# Patient Record
Sex: Female | Born: 1980 | Race: White | Hispanic: No | Marital: Married | Smoking: Never smoker
Health system: Southern US, Community
[De-identification: ages and names within clinical notes are randomized; demographics above are authoritative.]

## PROBLEM LIST (undated history)

## (undated) DIAGNOSIS — Z8619 Personal history of other infectious and parasitic diseases: Secondary | ICD-10-CM

## (undated) DIAGNOSIS — R87629 Unspecified abnormal cytological findings in specimens from vagina: Secondary | ICD-10-CM

## (undated) DIAGNOSIS — D649 Anemia, unspecified: Secondary | ICD-10-CM

## (undated) HISTORY — PX: NOSE SURGERY: SHX723

## (undated) HISTORY — PX: BREAST ENHANCEMENT SURGERY: SHX7

## (undated) HISTORY — PX: DILATION AND CURETTAGE OF UTERUS: SHX78

## (undated) HISTORY — DX: Unspecified abnormal cytological findings in specimens from vagina: R87.629

## (undated) HISTORY — DX: Personal history of other infectious and parasitic diseases: Z86.19

---

## 2008-08-30 HISTORY — PX: FOOT SURGERY: SHX648

## 2013-08-30 HISTORY — PX: HAND SURGERY: SHX662

## 2013-12-02 ENCOUNTER — Encounter (HOSPITAL_COMMUNITY): Payer: Self-pay | Admitting: Emergency Medicine

## 2013-12-02 ENCOUNTER — Emergency Department (HOSPITAL_COMMUNITY)
Admission: EM | Admit: 2013-12-02 | Discharge: 2013-12-02 | Disposition: A | Discharge status: Home / Self Care | Payer: BC Managed Care – PPO | Attending: Emergency Medicine | Admitting: Emergency Medicine

## 2013-12-02 ENCOUNTER — Emergency Department (HOSPITAL_COMMUNITY): Payer: BC Managed Care – PPO

## 2013-12-02 DIAGNOSIS — M25449 Effusion, unspecified hand: Secondary | ICD-10-CM | POA: Insufficient documentation

## 2013-12-02 DIAGNOSIS — Y9389 Activity, other specified: Secondary | ICD-10-CM | POA: Insufficient documentation

## 2013-12-02 DIAGNOSIS — Z79899 Other long term (current) drug therapy: Secondary | ICD-10-CM | POA: Insufficient documentation

## 2013-12-02 DIAGNOSIS — S62339A Displaced fracture of neck of unspecified metacarpal bone, initial encounter for closed fracture: Secondary | ICD-10-CM

## 2013-12-02 DIAGNOSIS — Y929 Unspecified place or not applicable: Secondary | ICD-10-CM | POA: Insufficient documentation

## 2013-12-02 DIAGNOSIS — S62309A Unspecified fracture of unspecified metacarpal bone, initial encounter for closed fracture: Secondary | ICD-10-CM | POA: Insufficient documentation

## 2013-12-02 DIAGNOSIS — W010XXA Fall on same level from slipping, tripping and stumbling without subsequent striking against object, initial encounter: Secondary | ICD-10-CM | POA: Insufficient documentation

## 2013-12-02 MED ORDER — OXYCODONE-ACETAMINOPHEN 5-325 MG PO TABS: 1.0000 | ORAL_TABLET | ORAL | Status: DC | PRN

## 2013-12-02 NOTE — ED Notes (Signed)
Patient transported to X-ray 

## 2013-12-02 NOTE — ED Notes (Signed)
Pt states that she tripped, fell, and injured her rt hand this morning around 0800.  Swelling noted to outside of rt hand.

## 2013-12-02 NOTE — Discharge Instructions (Signed)
Take the prescribed medication as directed for pain. Leave splint in place until follow-up appt. Follow-up with Dr. Melvyn Novasrtmann on Tuesday-- call office to verify appt time. Return to the ED for new or worsening symptoms.

## 2013-12-02 NOTE — ED Provider Notes (Signed)
Medical screening examination/treatment/procedure(s) were performed by non-physician practitioner and as supervising physician I was immediately available for consultation/collaboration.   EKG Interpretation None       Ethelda ChickMartha K Linker, MD 12/02/13 1253

## 2013-12-02 NOTE — ED Provider Notes (Signed)
CSN: [id number]     Arrival date & time 12/02/13  0840 History   First MD Initiated Contact with Patient 12/02/13 0930     Chief Complaint  Patient presents with  . Fall  . Hand Injury     (Consider location/radiation/quality/duration/timing/severity/associated sxs/prior Treatment) Patient is a 33 y.o. female presenting with fall and hand injury. The history is provided by medical records and the patient.  Fall Associated symptoms include arthralgias and joint swelling.  Hand Injury  This is a 33 y.o. F with no significant PMH presenting to the ED for right hand injury. Patient states she tripped and fell, landing awkwardly on her right hand. She denies head trauma or loss of consciousness. The fascia of sudden onset of pain and swelling to ulnar aspect of the dorsal right hand. Denies numbness/paresthesias. She denies prior right hand injury or surgery. Patient is right-hand dominant.  History reviewed. No pertinent past medical history. Past Surgical History  Procedure Laterality Date  . Foot surgery     History reviewed. No pertinent family history. History  Substance Use Topics  . Smoking status: Never Smoker   . Smokeless tobacco: Not on file  . Alcohol Use: No   OB History   Grav Para Term Preterm Abortions TAB SAB Ect Mult Living                 Review of Systems  Musculoskeletal: Positive for arthralgias and joint swelling.  All other systems reviewed and are negative.      Allergies  Review of patient's allergies indicates no known allergies.  Home Medications   Current Outpatient Rx  Name  Route  Sig  Dispense  Refill  . ibuprofen (ADVIL,MOTRIN) 200 MG tablet   Oral   Take 400 mg by mouth every 6 (six) hours as needed for headache.         . Norethindrone-Ethinyl Estradiol-Fe Biphas (LO LOESTRIN FE) 1 MG-10 MCG / 10 MCG tablet   Oral   Take 1 tablet by mouth every morning.          BP 100/59  Pulse 72  Temp(Src) 98.1 F (36.7 C) (Oral)  Resp  17  SpO2 100%  LMP 05/14/2013  Physical Exam  Nursing note and vitals reviewed. Constitutional: She is oriented to person, place, and time. She appears well-developed and well-nourished.  HENT:  Head: Normocephalic and atraumatic.  Mouth/Throat: Oropharynx is clear and moist.  Eyes: Conjunctivae and EOM are normal. Pupils are equal, round, and reactive to light.  Neck: Normal range of motion. Neck supple.  Cardiovascular: Normal rate, regular rhythm and normal heart sounds.   Pulmonary/Chest: Effort normal and breath sounds normal. No respiratory distress. She has no wheezes.  Musculoskeletal:       Right hand: She exhibits decreased range of motion, tenderness and bony tenderness. She exhibits normal two-point discrimination, normal capillary refill, no deformity, no laceration and no swelling. Normal sensation noted. Normal strength noted.       Hands: Swelling, bruising, and TTP along right 5th MCP; no gross deformity or skin tenting; full ROM of wrist maintained; moving all fingers appropraitely; strong radial pulse and cap refill; sensation intact  Neurological: She is alert and oriented to person, place, and time.  Skin: Skin is warm and dry. She is not diaphoretic.  Psychiatric: She has a normal mood and affect.    ED Course  Procedures (including critical care time) Labs Review Labs Reviewed - No data to display Imaging Review Dg  Hand Complete Right  12/02/2013   CLINICAL DATA:  FALL HAND INJURY  EXAM: RIGHT HAND - COMPLETE 3+ VIEW  COMPARISON:  None.  FINDINGS: Comminuted, impacted fracture of the head of the fifth metatarsal with volar angulation and displacement.  IMPRESSION: Fracture involving the head of the fifth metatarsal.   Electronically Signed   By: Salome HolmesHector  Cooper M.D.   On: 12/02/2013 09:55     EKG Interpretation None      MDM   Final diagnoses:  Boxer's fracture   Patient with comminuted and impacted boxer's fracture of right hand. Hand remains NVI,  compartment soft, without tenting.  Full ROM of wrist maintained.  Case was discussed with on-call hand surgery, Dr. Melvyn Novasrtmann, who will see patient in office on Tuesday for follow-up.  Pt placed in ulnar gutter splint.  Rx percocet.  Discussed plan with pt, she acknowledge understanding and agreed with plan of care.  Return precautions advised for new or worsening symptoms.  Garlon HatchetLisa M Camillo Quadros, PA-C 12/02/13 1246

## 2013-12-02 NOTE — ED Notes (Signed)
Pt refused discharge vitals 

## 2015-04-18 IMAGING — CR DG HAND COMPLETE 3+V*R*
4 series · 4 of 4 positions shown · non-contrast
Comparison: None.

CLINICAL DATA: FALL HAND INJURY

EXAM:
RIGHT HAND - COMPLETE 3+ VIEW

[x hand pa right]
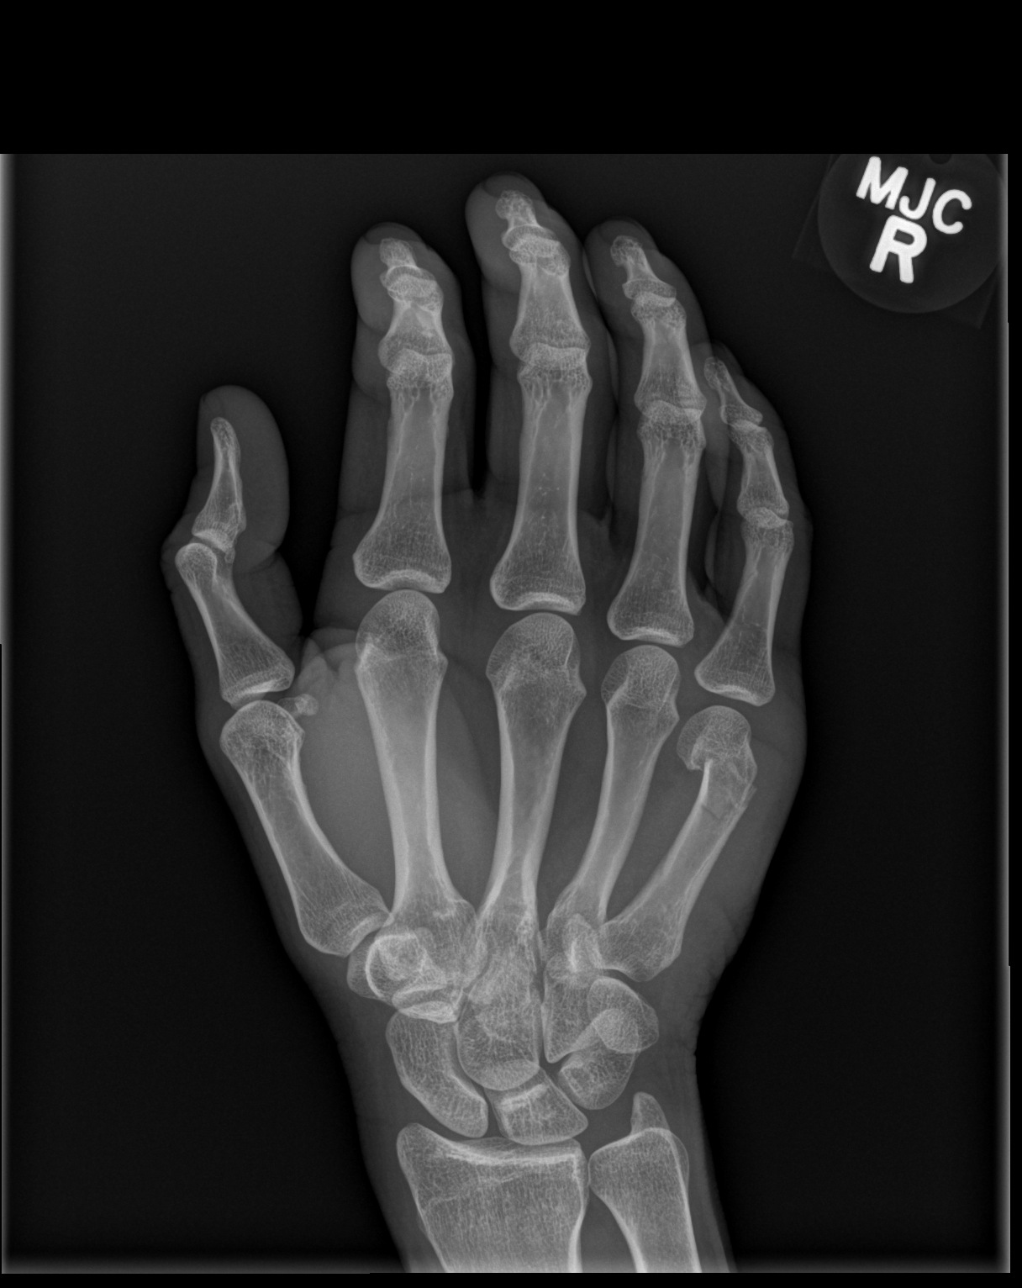

[x hand obl right]
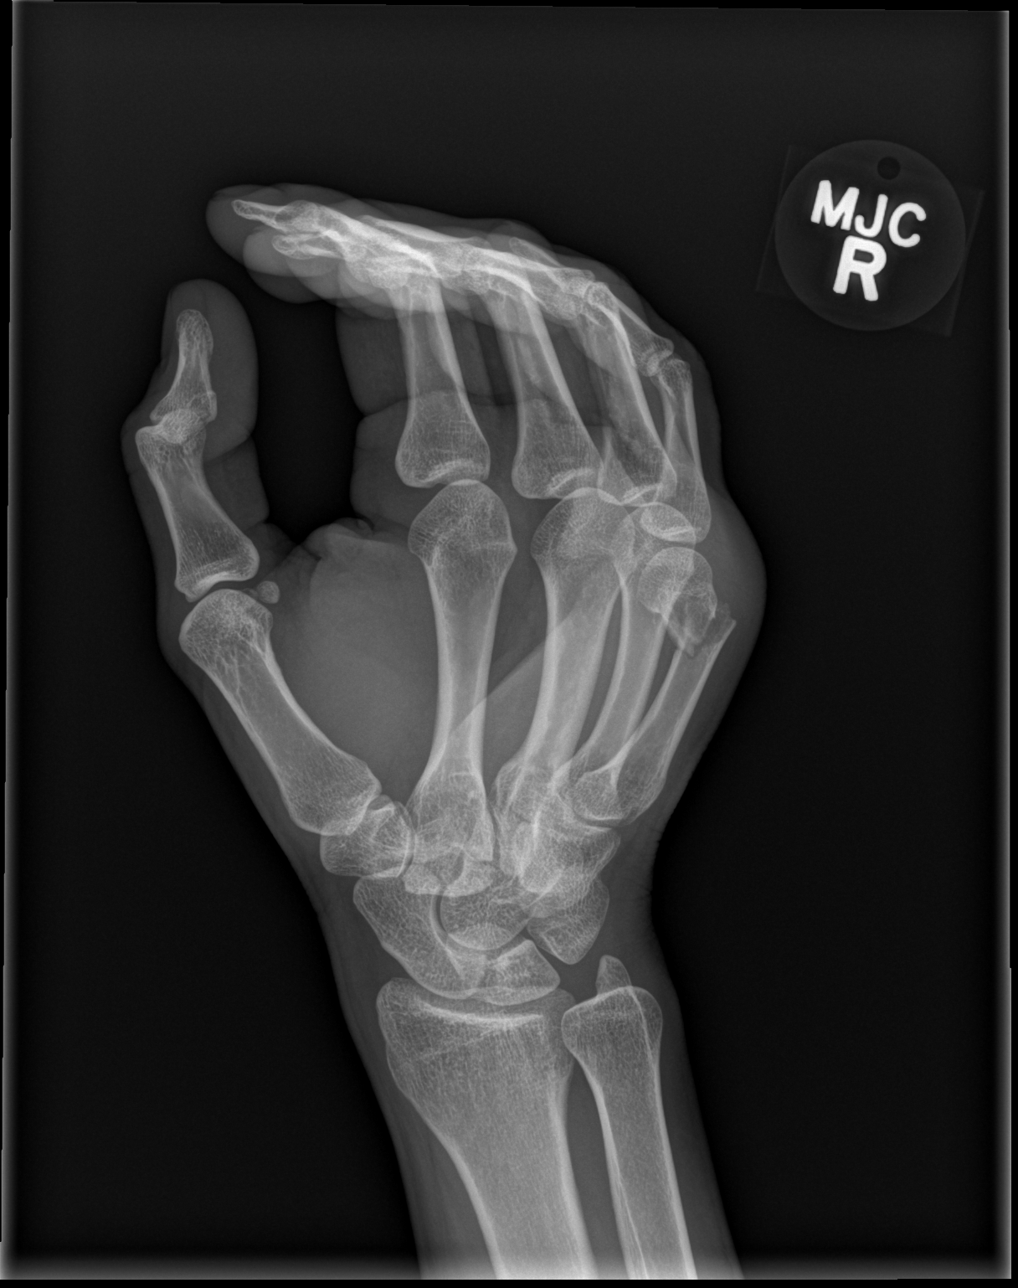

[x hand lat right (1 of 2)]
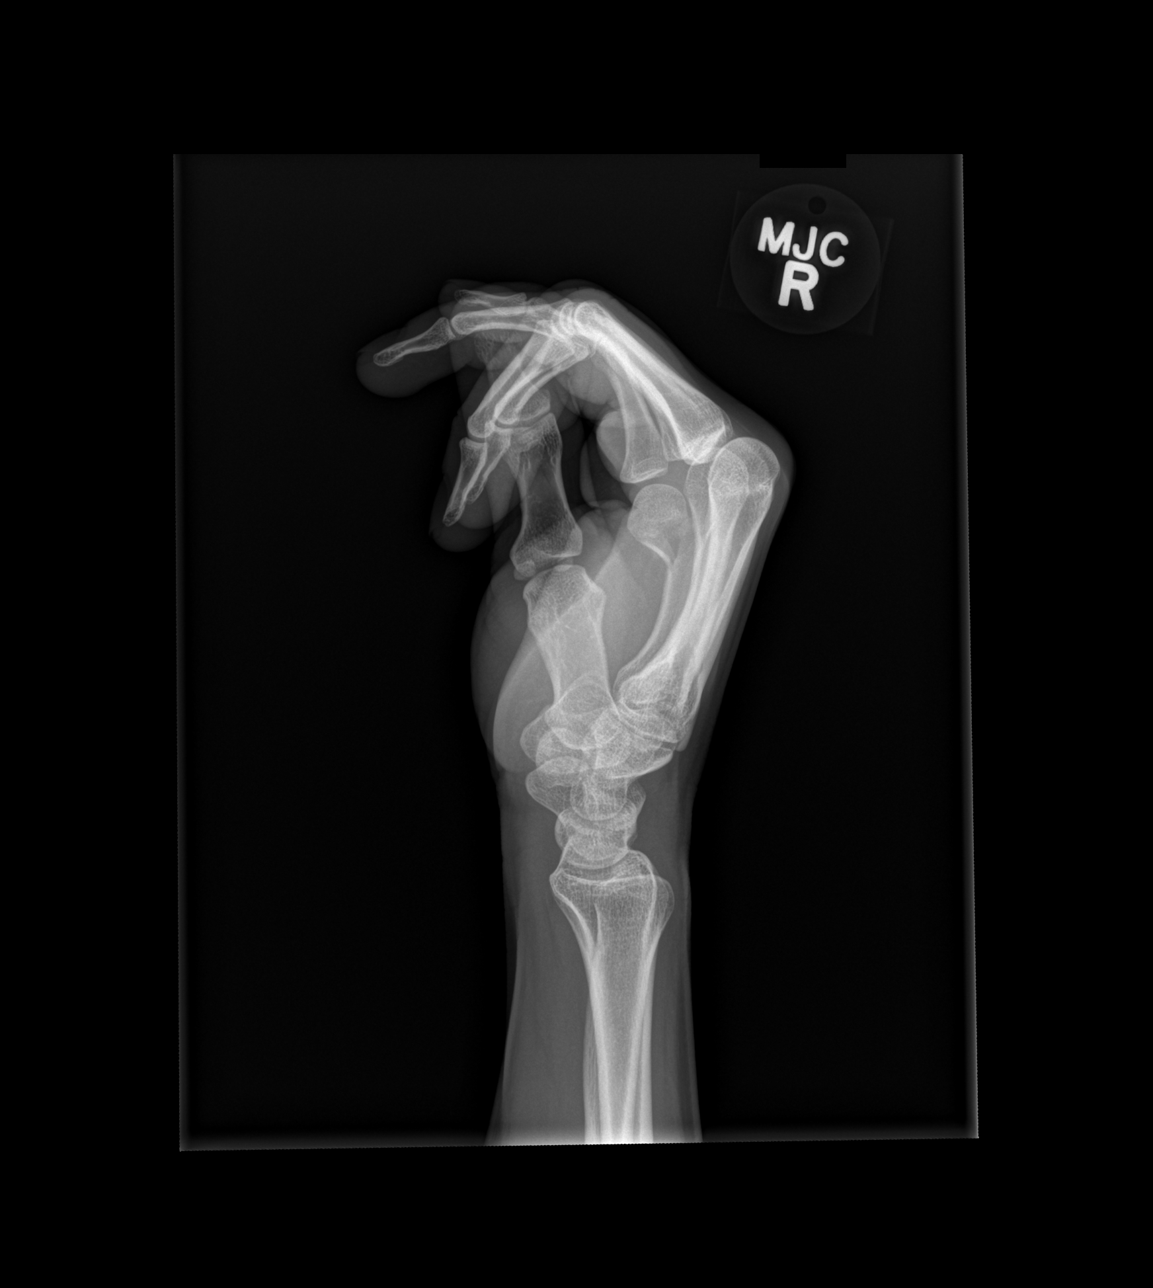

[x hand lat right (2 of 2)]
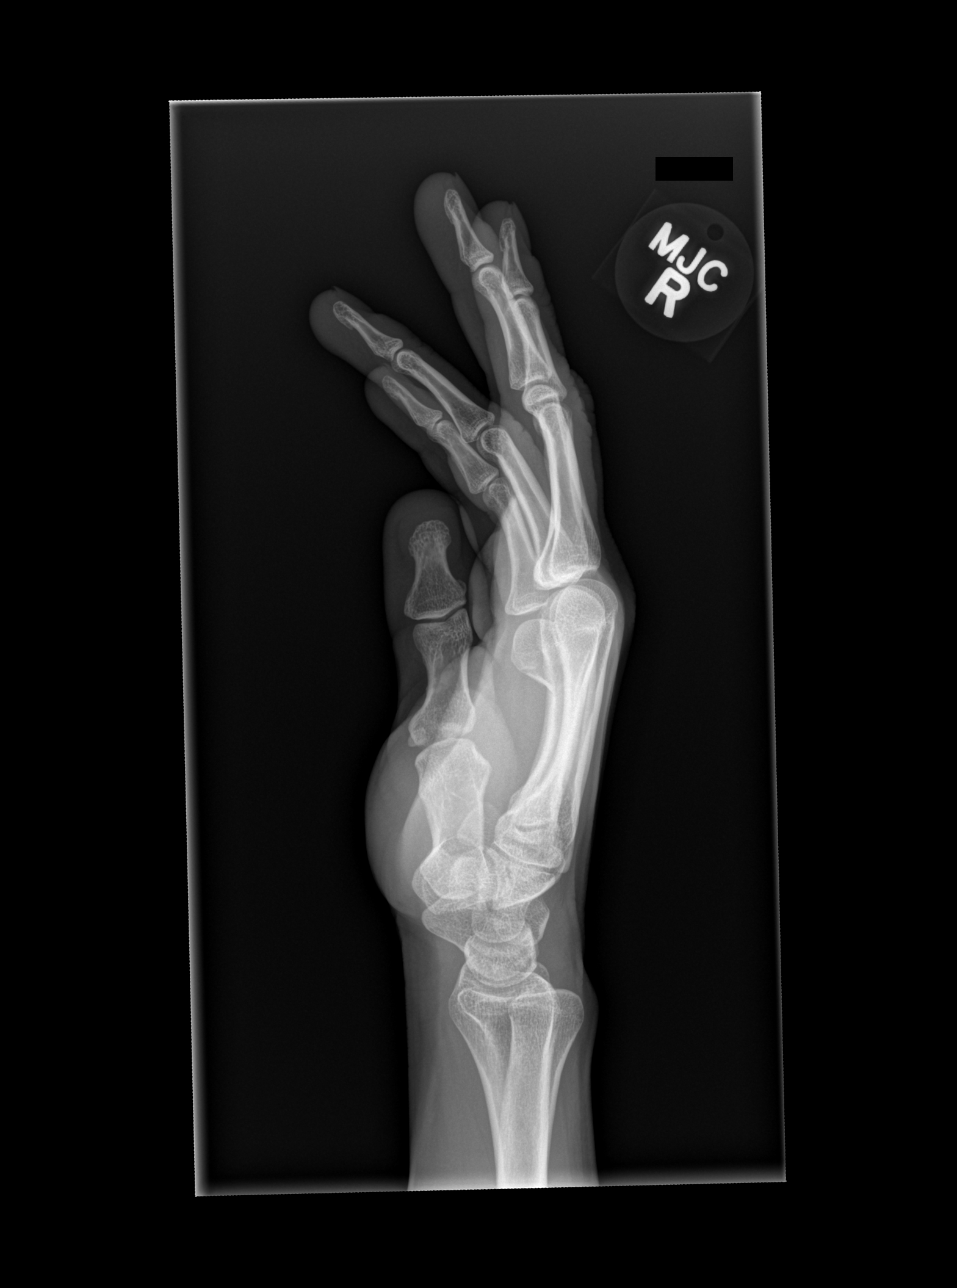

[4 of 4 positions shown; findings below may reference images not displayed]

FINDINGS: Comminuted, impacted fracture of the head of the fifth metatarsal
with volar angulation and displacement.
IMPRESSION: Fracture involving the head of the fifth metatarsal.

## 2017-08-30 NOTE — L&D Delivery Note (Signed)
Delivery Note:   Complete dilation at 08/13/2018  2220 Onset of pushing at 2220 FHR second stage Category 2 - variables  Analgesia /Anesthesia intrapartum:Epidural   Delivery of a Live born female  Birth Weight:pending   APGAR: 8, 9  Newborn Delivery   Birth date/time:  08/13/2018 23:45:00 Delivery type:  Vaginal, Spontaneous     Nuchal Cord:x 1 loose, reduced on perineum Cord double clamped after cessation of pulsation, cut by Trey PaulaJeff (FOB).  Collection of cord blood donation-None  Arterial cord blood sample-No   Cord blood collected for typing.    Placenta delivered-manual, partial separation with brisk bleeding / adherent,  with 3 vessels . Placenta to L&D for disposal.. Uterine tone firm after placenta removal, uterus explored , no placental parts left, bleeding small.  Bilateral deep labial splays identified.  Anesthesia:Epidural , local lido Repair:  3.0 and 2.0 vicryl running stitch, good hemostasis Est. Blood Loss (mL):842.00   Complications: ,Other Labor Complications: PROM > 24 hrs, protracted early labor needing cervical ripening with cervical balloon and cytotec, then Pitocin augmentation of labor at 8 cm.  APGAR:1 min-8, 5 min-9 Mom to postpartum.  Baby to Couplet care / Skin to Skin.  Ancef 2 gm IVPB for uterine exploration Methergine 0.2 mg PO q 4 hrs x 24 hrs prophylactic for retained placenta.   Delivery Report:  Review the Delivery Report for details.     Signed: Neta Mendsaniela C Astra Gregg, CNM, MSN 08/14/2018, 12:30 AM

## 2018-01-03 LAB — OB RESULTS CONSOLE RUBELLA ANTIBODY, IGM: Rubella: IMMUNE

## 2018-01-03 LAB — OB RESULTS CONSOLE HEPATITIS B SURFACE ANTIGEN: Hepatitis B Surface Ag: NEGATIVE

## 2018-01-03 LAB — OB RESULTS CONSOLE HIV ANTIBODY (ROUTINE TESTING): HIV: NONREACTIVE

## 2018-01-03 LAB — OB RESULTS CONSOLE RPR: RPR: NONREACTIVE

## 2018-07-20 LAB — OB RESULTS CONSOLE GBS: GBS: NEGATIVE

## 2018-08-13 ENCOUNTER — Other Ambulatory Visit: Payer: Self-pay

## 2018-08-13 ENCOUNTER — Inpatient Hospital Stay (HOSPITAL_COMMUNITY)
Admission: AD | Admit: 2018-08-13 | Discharge: 2018-08-15 | DRG: 807 | Disposition: A | Discharge status: Home / Self Care | Payer: No Typology Code available for payment source

## 2018-08-13 ENCOUNTER — Inpatient Hospital Stay (HOSPITAL_COMMUNITY): Payer: No Typology Code available for payment source | Admitting: Anesthesiology

## 2018-08-13 ENCOUNTER — Encounter (HOSPITAL_COMMUNITY): Payer: Self-pay | Admitting: *Deleted

## 2018-08-13 DIAGNOSIS — Z3A40 40 weeks gestation of pregnancy: Secondary | ICD-10-CM | POA: Diagnosis not present

## 2018-08-13 DIAGNOSIS — O4212 Full-term premature rupture of membranes, onset of labor more than 24 hours following rupture: Secondary | ICD-10-CM | POA: Diagnosis present

## 2018-08-13 DIAGNOSIS — O4290 Premature rupture of membranes, unspecified as to length of time between rupture and onset of labor, unspecified weeks of gestation: Secondary | ICD-10-CM | POA: Diagnosis present

## 2018-08-13 DIAGNOSIS — O9902 Anemia complicating childbirth: Secondary | ICD-10-CM | POA: Diagnosis present

## 2018-08-13 DIAGNOSIS — D509 Iron deficiency anemia, unspecified: Secondary | ICD-10-CM | POA: Diagnosis present

## 2018-08-13 DIAGNOSIS — O99019 Anemia complicating pregnancy, unspecified trimester: Secondary | ICD-10-CM | POA: Diagnosis present

## 2018-08-13 DIAGNOSIS — Z3490 Encounter for supervision of normal pregnancy, unspecified, unspecified trimester: Secondary | ICD-10-CM

## 2018-08-13 LAB — CBC
HCT: 32.2 % — ABNORMAL LOW (ref 36.0–46.0)
Hemoglobin: 10.2 g/dL — ABNORMAL LOW (ref 12.0–15.0)
MCH: 26.4 pg (ref 26.0–34.0)
MCHC: 31.7 g/dL (ref 30.0–36.0)
MCV: 83.2 fL (ref 80.0–100.0)
PLATELETS: 247 10*3/uL (ref 150–400)
RBC: 3.87 MIL/uL (ref 3.87–5.11)
RDW: 14.2 % (ref 11.5–15.5)
WBC: 10.3 10*3/uL (ref 4.0–10.5)
nRBC: 0 % (ref 0.0–0.2)

## 2018-08-13 LAB — WET PREP, GENITAL
CLUE CELLS WET PREP: NONE SEEN
Sperm: NONE SEEN
TRICH WET PREP: NONE SEEN
Yeast Wet Prep HPF POC: NONE SEEN

## 2018-08-13 LAB — TYPE AND SCREEN
ABO/RH(D): A POS
Antibody Screen: NEGATIVE

## 2018-08-13 LAB — AMNISURE RUPTURE OF MEMBRANE (ROM) NOT AT ARMC: AMNISURE: POSITIVE

## 2018-08-13 LAB — ABO/RH: ABO/RH(D): A POS

## 2018-08-13 MED ORDER — EPHEDRINE 5 MG/ML INJ: 10.0000 mg | INTRAVENOUS | Status: DC | PRN

## 2018-08-13 MED ORDER — MISOPROSTOL 50MCG HALF TABLET
50.0000 ug | ORAL_TABLET | ORAL | Status: DC | PRN
  Administered 2018-08-13: 50 ug via BUCCAL
  Filled 2018-08-13 (×2): qty 1

## 2018-08-13 MED ORDER — OXYTOCIN BOLUS FROM INFUSION: 500.0000 mL | Freq: Once | INTRAVENOUS | Status: DC

## 2018-08-13 MED ORDER — LACTATED RINGERS IV SOLN
INTRAVENOUS | Status: DC
  Administered 2018-08-13: 20:00:00 via INTRAVENOUS

## 2018-08-13 MED ORDER — LACTATED RINGERS IV SOLN: INTRAVENOUS | Status: DC

## 2018-08-13 MED ORDER — TERBUTALINE SULFATE 1 MG/ML IJ SOLN
0.2500 mg | Freq: Once | INTRAMUSCULAR | Status: DC | PRN
  Filled 2018-08-13: qty 1

## 2018-08-13 MED ORDER — EPHEDRINE 5 MG/ML INJ
10.0000 mg | INTRAVENOUS | Status: DC | PRN
  Filled 2018-08-13: qty 2
  Filled 2018-08-13: qty 4

## 2018-08-13 MED ORDER — LACTATED RINGERS IV SOLN: 500.0000 mL | Freq: Once | INTRAVENOUS | Status: DC

## 2018-08-13 MED ORDER — OXYCODONE-ACETAMINOPHEN 5-325 MG PO TABS: 1.0000 | ORAL_TABLET | ORAL | Status: DC | PRN

## 2018-08-13 MED ORDER — ACETAMINOPHEN 325 MG PO TABS
650.0000 mg | ORAL_TABLET | ORAL | Status: DC | PRN
  Administered 2018-08-14: 650 mg via ORAL
  Filled 2018-08-13: qty 2

## 2018-08-13 MED ORDER — EPHEDRINE 5 MG/ML INJ
10.0000 mg | INTRAVENOUS | Status: DC | PRN
  Filled 2018-08-13: qty 2

## 2018-08-13 MED ORDER — SOD CITRATE-CITRIC ACID 500-334 MG/5ML PO SOLN: 30.0000 mL | ORAL | Status: DC | PRN

## 2018-08-13 MED ORDER — OXYTOCIN 10 UNIT/ML IJ SOLN
10.0000 [IU] | Freq: Once | INTRAMUSCULAR | Status: DC
  Filled 2018-08-13: qty 1

## 2018-08-13 MED ORDER — PHENYLEPHRINE 40 MCG/ML (10ML) SYRINGE FOR IV PUSH (FOR BLOOD PRESSURE SUPPORT)
80.0000 ug | PREFILLED_SYRINGE | INTRAVENOUS | Status: DC | PRN
  Administered 2018-08-13: 80 ug via INTRAVENOUS
  Filled 2018-08-13 (×3): qty 10

## 2018-08-13 MED ORDER — OXYCODONE-ACETAMINOPHEN 5-325 MG PO TABS: 2.0000 | ORAL_TABLET | ORAL | Status: DC | PRN

## 2018-08-13 MED ORDER — PHENYLEPHRINE 40 MCG/ML (10ML) SYRINGE FOR IV PUSH (FOR BLOOD PRESSURE SUPPORT)
80.0000 ug | PREFILLED_SYRINGE | INTRAVENOUS | Status: AC | PRN
  Administered 2018-08-13 (×3): 80 ug via INTRAVENOUS

## 2018-08-13 MED ORDER — MINERAL OIL PO OIL
TOPICAL_OIL | Freq: Once | ORAL | Status: AC
  Administered 2018-08-13: 30 mL
  Filled 2018-08-13: qty 30

## 2018-08-13 MED ORDER — FLEET ENEMA 7-19 GM/118ML RE ENEM: 1.0000 | ENEMA | Freq: Once | RECTAL | Status: DC | PRN

## 2018-08-13 MED ORDER — LACTATED RINGERS IV SOLN: 500.0000 mL | INTRAVENOUS | Status: DC | PRN

## 2018-08-13 MED ORDER — FENTANYL 2.5 MCG/ML BUPIVACAINE 1/10 % EPIDURAL INFUSION (WH - ANES)
14.0000 mL/h | INTRAMUSCULAR | Status: DC | PRN
  Administered 2018-08-13: 14 mL/h via EPIDURAL
  Filled 2018-08-13: qty 100

## 2018-08-13 MED ORDER — ONDANSETRON HCL 4 MG/2ML IJ SOLN: 4.0000 mg | Freq: Four times a day (QID) | INTRAMUSCULAR | Status: DC | PRN

## 2018-08-13 MED ORDER — OXYTOCIN 40 UNITS IN LACTATED RINGERS INFUSION - SIMPLE MED
1.0000 m[IU]/min | INTRAVENOUS | Status: DC
  Administered 2018-08-13: 2 m[IU]/min via INTRAVENOUS
  Filled 2018-08-13: qty 1000

## 2018-08-13 MED ORDER — DIPHENHYDRAMINE HCL 50 MG/ML IJ SOLN: 12.5000 mg | INTRAMUSCULAR | Status: DC | PRN

## 2018-08-13 MED ORDER — BISACODYL 10 MG RE SUPP
10.0000 mg | Freq: Once | RECTAL | Status: DC
  Filled 2018-08-13: qty 1

## 2018-08-13 MED ORDER — OXYTOCIN 40 UNITS IN LACTATED RINGERS INFUSION - SIMPLE MED: 2.5000 [IU]/h | INTRAVENOUS | Status: DC

## 2018-08-13 MED ORDER — PHENYLEPHRINE 40 MCG/ML (10ML) SYRINGE FOR IV PUSH (FOR BLOOD PRESSURE SUPPORT): 80.0000 ug | PREFILLED_SYRINGE | INTRAVENOUS | Status: DC | PRN

## 2018-08-13 MED ORDER — LIDOCAINE HCL (PF) 1 % IJ SOLN
INTRAMUSCULAR | Status: DC | PRN
  Administered 2018-08-13: 5 mL via EPIDURAL
  Administered 2018-08-13: 3 mL via EPIDURAL
  Administered 2018-08-13: 2 mL via EPIDURAL

## 2018-08-13 MED ORDER — LIDOCAINE HCL (PF) 1 % IJ SOLN
30.0000 mL | INTRAMUSCULAR | Status: DC | PRN
  Administered 2018-08-14: 30 mL via SUBCUTANEOUS
  Filled 2018-08-13: qty 30

## 2018-08-13 NOTE — MAU Note (Signed)
Rosalita ChessmanLyndsey Aloi is a 37 y.o. at 434w3d here in MAU reporting: +?LOF; clear; since yesterday Patient reports EDD of last Thursday (08/10/18) Pain score: denies at this time Vitals:   08/13/18 0949  BP: 120/72  Pulse: 69  Resp: 19  Temp: (!) 97.5 F (36.4 C)  SpO2: 100%     FHT: 130 Lab orders placed from triage: none

## 2018-08-13 NOTE — Anesthesia Pain Management Evaluation Note (Signed)
  CRNA Pain Management Visit Note  Patient: Andrea Bradford, 37 y.o., female  "Hello I am a member of the anesthesia team at [facility]. We have an anesthesia team available at all times to provide care throughout the hospital, including epidural management and anesthesia for C-section. I don't know your plan for the delivery whether it a natural birth, water birth, IV sedation, nitrous supplementation, doula or epidural, but we want to meet your pain goals."   1.Was your pain managed to your expectations on prior hospitalizations?   No prior hospitalizations  2.What is your expectation for pain management during this hospitalization?     Water tub  3.How can we help you reach that goal? Patient would like a natural water birth   Record the patient's initial score and the patient's pain goal.   Pain: 9  Pain Goal: 10 [facility] wants you to be able to say your pain was always managed very well.  Rica RecordsICKELTON,Casimer Russett 08/13/2018

## 2018-08-13 NOTE — H&P (Signed)
OB ADMISSION/ HISTORY & PHYSICAL:  Admission Date: 08/13/2018  9:31 AM  Admit Diagnosis: PROM at term    Andrea Bradford is a 37 y.o. female presenting for LOF. Reports feeling more "wet" on Thursday while at work, then intermittently feeling wet Saturday. Placed a pad overnight and reports half soaked w/clear appearing fluid in AM when she called to report to CNM. Reports feeling more cramping last couple of days, but no obvious ctx pattern.   Prenatal History: G2P0010  EDC : 08/10/2018,  [facility] 30.6 [facility] , [facility] CNM [facility].   Prenatal course complicated by: AMA, IVF pregnancy, hx vanishing twin in 1st trim, IDA anemia on supplement ASCUS pap w/ + HR HPV, for F/U PP  Prenatal Labs: ABO, Rh:   A pos Antibody: NEG (12/15 1107) Rubella:   immune RPR:   NR HBsAg:  Neg  HIV:   Neg GBS:   neg  2 hr Glucola : pass Genetic Screening: NT/ultrascreen and MSAFP wnl Ultrasound: normal XX, anterior placenta  Medical / Surgical History :  Past medical history: History reviewed. No pertinent past medical history.   Past surgical history:  Past Surgical History:  Procedure Laterality Date  . FOOT SURGERY  2010  . HAND SURGERY  2015  . NOSE SURGERY     1998 and 2018   Breast augmentation  Family History:  Family History  Problem Relation Age of Onset  . Cancer Mother   . Cancer Sister      Social History:  reports that she has never smoked. She has never used smokeless tobacco. She reports that she does not drink alcohol or use drugs.   Allergies: Patient has no known allergies.   Current Medications at time of admission:  Medications Prior to Admission  Medication Sig Dispense Refill Last Dose  . Prenatal Vit-Fe Fumarate-FA (PRENATAL MULTIVITAMIN) TABS tablet Take 1 tablet by mouth daily at 12 noon.   08/12/2018 at Unknown time     Review of Systems: ROS Mild cramping, +LOF, +FM, no fever/chills  Physical  Exam: Vital signs and nursing notes reviewed.  ED Triage Vitals [08/13/18 0949]  Enc Vitals Group     BP 120/72     Pulse Rate 69     Resp 19     Temp (!) 97.5 F (36.4 C)     Temp Source Oral     SpO2 100 %     Weight 184 lb 6.4 oz (83.6 kg)     Height      Head Circumference      Peak Flow      Pain Score      Pain Loc      Pain Edu?      Excl. in GC?      General: AAO x 3, NAD Heart: RRR Lungs:CTAB Abdomen: Gravid, NT, Leopold's vrtx, EFW 7.5-8 lbs Extremities: no edema Genitalia / VE: Dilation: 2.5 Effacement (%): 80 Station: -2 Presentation: Vertex Exam by:: Arlan Organaniela Mahati Vajda CNM  Forebag noted, clear AF + amnisure, neg wet prep  FHR:  125 BPM, moderate variability, + accels, no decels TOCO: Ctx q 2-5 min, mild pain in back and r side  Labs:   Pending T&S, RPR  Recent Labs    08/13/18 1059  WBC 10.3  HGB 10.2*  HCT 32.2*  PLT 247       Assessment:  37 y.o. G1P0 at 7888w3d, IVF pregnancy, PROM > 24 hrs  1. Latent stage of labor 2. FHR category 1 3. GBS negative 4. Desires low intervention / Musician and birth 5. Breastfeeding   Plan:  1. Admit to BS, recommend IOL given likely ROM > 24 hours and no active labor Advised Pitocin IOL for expedited management and decreased risk of chorio.  Declines Pitocin but agrees to oral Cytotec and cervical balloon if needed. Wants to avoid continuous EFM and IV placement. Monitor closely for s/sx chorio, Limited cervical exams Cytotec 50 mcg buccal q 4 hrs PRN, will place CB if no change after first dose.  2. Routine L&D orders 3. Continuous labor support / doula, hydrotherapy in active labor if stable 4. Anticipate NSVB 5. Placenta disposal per patient request  Dr Billy Coast notified of admission / plan of care   Neta Mends CNM, MSN 08/13/2018, 2:02 PM

## 2018-08-13 NOTE — Progress Notes (Signed)
Patient ID: Andrea Bradford, female   DOB: 1980/10/03, 37 y.o.   MRN: [id number] Andrea Bradford is a 37 y.o. G2P0010 at 7138w3d by  ETD ,admitted for IOL / PROM  Subjective: Starting to feel ctx more, resting in lateral position, one dose oral Cytotec given 1.5 hrs ago.  Continues to leak clear AF occasionally. Has some bloody show since exam I MAU.  Plans waterbirth, using doula. [facility] and [facility].   Objective: Vitals:   08/13/18 0949 08/13/18 1156 08/13/18 1318  BP: 120/72 113/69 (!) 107/55  Pulse: 69 71 69  Resp: 19 16   Temp: (!) 97.5 F (36.4 C) 98.4 F (36.9 C) 98.5 F (36.9 C)  TempSrc: Oral Oral Oral  SpO2: 100%    Weight: 83.6 kg 82.1 kg   Height:  5\' 6"  (1.676 m)     FHT:  FHR: 125 bpm, variability: moderate,  accelerations:  Present,  decelerations:  Absent UC:   regular, every 1-3 minutes SVE:   Dilation: 2.5 Effacement (%): 80 Station: -2 Exam by:: Arlan Organaniela Mercadez Heitman CNM Unchanged since previous exam. Cervical balloon placed after counseling, inflated w/ > 60 cc and traction to leg. Patient coping well but looks a lot more uncomfortable.   Labs:   Recent Labs    08/13/18 1059  WBC 10.3  HGB 10.2*  HCT 32.2*  PLT 247    Assessment / Plan: G2P0010 at 40.3 wks  PROM > 24 hrs,   Labor: Cervical ripening with oral Cytotec, x 1 dose, now w/ added cervical balloon  Preeclampsia:  no signs or symptoms of toxicity Fetal Wellbeing:  Category I Pain Control:  Labor support without medications and plans hydrotherapy in active labor I/D:   afebrile, no s/sx chorio, GBS neg.  Anticipated MOD:  NSVD IVF pregnancy - AMTSL  Neta Mendsaniela C Shree Espey, CNM, MSN 08/13/2018, 1:41 PM

## 2018-08-13 NOTE — Anesthesia Preprocedure Evaluation (Signed)
Anesthesia Evaluation  Patient identified by MRN, date of birth, ID band Patient awake    Reviewed: Allergy & Precautions, Patient's Chart, lab work & pertinent test results  Airway Mallampati: II  TM Distance: >3 FB     Dental   Pulmonary neg pulmonary ROS,    Pulmonary exam normal        Cardiovascular negative cardio ROS Normal cardiovascular exam     Neuro/Psych negative neurological ROS     GI/Hepatic negative GI ROS, Neg liver ROS,   Endo/Other  negative endocrine ROS  Renal/GU negative Renal ROS     Musculoskeletal   Abdominal   Peds  Hematology  (+) anemia ,   Anesthesia Other Findings   Reproductive/Obstetrics (+) Pregnancy                             Lab Results  Component Value Date   WBC 10.3 08/13/2018   HGB 10.2 (L) 08/13/2018   HCT 32.2 (L) 08/13/2018   MCV 83.2 08/13/2018   PLT 247 08/13/2018    Anesthesia Physical Anesthesia Plan  ASA: II  Anesthesia Plan: Epidural   Post-op Pain Management:    Induction:   PONV Risk Score and Plan: Treatment may vary due to age or medical condition  Airway Management Planned: Natural Airway  Additional Equipment:   Intra-op Plan:   Post-operative Plan:   Informed Consent: I have reviewed the patients History and Physical, chart, labs and discussed the procedure including the risks, benefits and alternatives for the proposed anesthesia with the patient or authorized representative who has indicated his/her understanding and acceptance.     Plan Discussed with:   Anesthesia Plan Comments:         Anesthesia Quick Evaluation

## 2018-08-13 NOTE — Progress Notes (Signed)
S: Labored in various position, trial of hydrotherapy then nitrous, working with doula but unable to relax, vocalizing w/ ctx. Trey PaulaJeff supporting/coaching labor.   O:  Vitals:   08/13/18 1440 08/13/18 1555 08/13/18 1700 08/13/18 1755  BP: (!) 95/53 95/61 (!) 105/53 (!) 108/56  Pulse: 82 96 88 88  Resp: 18 16 18    Temp:   98.4 F (36.9 C)   TempSrc:   Oral   SpO2:      Weight:      Height:       FHR 125, mod var, + accels, no decels Ctx q 2-3 min, palp mod/strong  VE unchanged 7-8/90/-1, asynclitic  A/P: Protracted labor FHT cat1 Poor coping w/ labor pain  Advise epidural and Pitocin augmentation, r/b reviewed, patient agrees.  Plan IUPC when comfortable  Neta Mendsaniela C Paul, MSN, CNM 08/13/2018, 7:15 PM

## 2018-08-13 NOTE — Anesthesia Procedure Notes (Signed)
Epidural Patient location during procedure: OB Start time: 08/13/2018 7:35 PM End time: 08/13/2018 7:41 PM  Staffing Anesthesiologist: Marcene DuosFitzgerald, Wyolene Weimann, MD Performed: anesthesiologist   Preanesthetic Checklist Completed: patient identified, site marked, surgical consent, pre-op evaluation, timeout performed, IV checked, risks and benefits discussed and monitors and equipment checked  Epidural Patient position: sitting Prep: site prepped and draped and DuraPrep Patient monitoring: continuous pulse ox and blood pressure Approach: midline Location: L4-L5 Injection technique: LOR air  Needle:  Needle type: Tuohy  Needle gauge: 17 G Needle length: 9 cm and 9 Needle insertion depth: 6 cm Catheter type: closed end flexible Catheter size: 19 Gauge Catheter at skin depth: 11 cm Test dose: negative  Assessment Events: blood not aspirated, injection not painful, no injection resistance, negative IV test and no paresthesia

## 2018-08-13 NOTE — Progress Notes (Signed)
Patient ID: Andrea Bradford, female   DOB: 10/01/1980, 37 y.o.   MRN: [id number] S: Working hard w/ ctx, cervical balloon expelled at 1430.   O: Vitals:   08/13/18 0949 08/13/18 1156 08/13/18 1318  BP: 120/72 113/69 (!) 107/55  Pulse: 69 71 69  Resp: 19 16   Temp: (!) 97.5 F (36.4 C) 98.4 F (36.9 C) 98.5 F (36.9 C)  TempSrc: Oral Oral Oral  SpO2: 100%    Weight: 83.6 kg 82.1 kg   Height:  5\' 6"  (1.676 m)      FHT: 140, mod var, + response to scalp stim, early/small variables UC:   regular, every 2-3 minutes, palp mod/strong SVE:   Dilation: 7 Effacement (%): 80 Station: -1 Exam by:: Arlan Organaniela Mylez Venable CNM Forebag SROM w/ exam, clear AF Acynclitic vertex  A / P: IOL 2/2 PROM at term Good progress after cytotc and cervical balloon. Continue expectantly, physiologic labor Fetal Wellbeing:  Category II Pain Control:  Labor support without medications Position changes to facilitate rotation. Anticipated MOD:  NSVD  Neta Mendsaniela C Naliyah Neth, CNM, MSN 08/13/2018, 4:21 PM

## 2018-08-14 ENCOUNTER — Encounter (HOSPITAL_COMMUNITY): Payer: Self-pay

## 2018-08-14 DIAGNOSIS — D509 Iron deficiency anemia, unspecified: Secondary | ICD-10-CM | POA: Diagnosis present

## 2018-08-14 DIAGNOSIS — O99019 Anemia complicating pregnancy, unspecified trimester: Secondary | ICD-10-CM | POA: Diagnosis present

## 2018-08-14 LAB — CBC
HCT: 28.3 % — ABNORMAL LOW (ref 36.0–46.0)
Hemoglobin: 9.1 g/dL — ABNORMAL LOW (ref 12.0–15.0)
MCH: 26.1 pg (ref 26.0–34.0)
MCHC: 32.2 g/dL (ref 30.0–36.0)
MCV: 81.1 fL (ref 80.0–100.0)
Platelets: 203 10*3/uL (ref 150–400)
RBC: 3.49 MIL/uL — ABNORMAL LOW (ref 3.87–5.11)
RDW: 14.4 % (ref 11.5–15.5)
WBC: 17.6 10*3/uL — ABNORMAL HIGH (ref 4.0–10.5)
nRBC: 0 % (ref 0.0–0.2)

## 2018-08-14 LAB — RPR: RPR Ser Ql: NONREACTIVE

## 2018-08-14 MED ORDER — PRENATAL MULTIVITAMIN CH
1.0000 | ORAL_TABLET | Freq: Every day | ORAL | Status: DC
  Administered 2018-08-14 – 2018-08-15 (×2): 1 via ORAL
  Filled 2018-08-14 (×2): qty 1

## 2018-08-14 MED ORDER — DIBUCAINE 1 % RE OINT: 1.0000 "application " | TOPICAL_OINTMENT | RECTAL | Status: DC | PRN

## 2018-08-14 MED ORDER — POLYSACCHARIDE IRON COMPLEX 150 MG PO CAPS
150.0000 mg | ORAL_CAPSULE | Freq: Every day | ORAL | Status: DC
  Administered 2018-08-14 – 2018-08-15 (×2): 150 mg via ORAL
  Filled 2018-08-14 (×2): qty 1

## 2018-08-14 MED ORDER — DOCUSATE SODIUM 100 MG PO CAPS
100.0000 mg | ORAL_CAPSULE | Freq: Two times a day (BID) | ORAL | Status: DC
  Administered 2018-08-14: 100 mg via ORAL
  Filled 2018-08-14 (×2): qty 1

## 2018-08-14 MED ORDER — FLEET ENEMA 7-19 GM/118ML RE ENEM: 1.0000 | ENEMA | Freq: Every day | RECTAL | Status: DC | PRN

## 2018-08-14 MED ORDER — ONDANSETRON HCL 4 MG PO TABS: 4.0000 mg | ORAL_TABLET | ORAL | Status: DC | PRN

## 2018-08-14 MED ORDER — ACETAMINOPHEN 325 MG PO TABS: 650.0000 mg | ORAL_TABLET | ORAL | Status: DC | PRN

## 2018-08-14 MED ORDER — ONDANSETRON HCL 4 MG/2ML IJ SOLN: 4.0000 mg | INTRAMUSCULAR | Status: DC | PRN

## 2018-08-14 MED ORDER — BENZOCAINE-MENTHOL 20-0.5 % EX AERO: 1.0000 "application " | INHALATION_SPRAY | CUTANEOUS | Status: DC | PRN

## 2018-08-14 MED ORDER — OXYCODONE HCL 5 MG PO TABS: 5.0000 mg | ORAL_TABLET | ORAL | Status: DC | PRN

## 2018-08-14 MED ORDER — SIMETHICONE 80 MG PO CHEW: 80.0000 mg | CHEWABLE_TABLET | ORAL | Status: DC | PRN

## 2018-08-14 MED ORDER — IBUPROFEN 600 MG PO TABS
600.0000 mg | ORAL_TABLET | Freq: Four times a day (QID) | ORAL | Status: DC
  Administered 2018-08-14 – 2018-08-15 (×5): 600 mg via ORAL
  Filled 2018-08-14 (×7): qty 1

## 2018-08-14 MED ORDER — COCONUT OIL OIL
1.0000 "application " | TOPICAL_OIL | Status: DC | PRN
  Filled 2018-08-14: qty 120

## 2018-08-14 MED ORDER — MAGNESIUM OXIDE 400 (241.3 MG) MG PO TABS
400.0000 mg | ORAL_TABLET | Freq: Every day | ORAL | Status: DC
  Administered 2018-08-14: 400 mg via ORAL
  Filled 2018-08-14 (×3): qty 1

## 2018-08-14 MED ORDER — BISACODYL 10 MG RE SUPP: 10.0000 mg | Freq: Every day | RECTAL | Status: DC | PRN

## 2018-08-14 MED ORDER — METHYLERGONOVINE MALEATE 0.2 MG PO TABS
0.2000 mg | ORAL_TABLET | ORAL | Status: DC
  Administered 2018-08-14 (×3): 0.2 mg via ORAL
  Filled 2018-08-14 (×3): qty 1

## 2018-08-14 MED ORDER — CEFAZOLIN SODIUM-DEXTROSE 2-4 GM/100ML-% IV SOLN
2.0000 g | Freq: Once | INTRAVENOUS | Status: AC
  Administered 2018-08-14: 2 g via INTRAVENOUS

## 2018-08-14 MED ORDER — TETANUS-DIPHTH-ACELL PERTUSSIS 5-2.5-18.5 LF-MCG/0.5 IM SUSP: 0.5000 mL | Freq: Once | INTRAMUSCULAR | Status: DC

## 2018-08-14 MED ORDER — WITCH HAZEL-GLYCERIN EX PADS: 1.0000 "application " | MEDICATED_PAD | CUTANEOUS | Status: DC | PRN

## 2018-08-14 MED ORDER — DIPHENHYDRAMINE HCL 25 MG PO CAPS: 25.0000 mg | ORAL_CAPSULE | Freq: Four times a day (QID) | ORAL | Status: DC | PRN

## 2018-08-14 MED ORDER — METHYLERGONOVINE MALEATE 0.2 MG/ML IJ SOLN
INTRAMUSCULAR | Status: AC
  Filled 2018-08-14: qty 1

## 2018-08-14 NOTE — Lactation Note (Signed)
This note was copied from a baby's chart. Lactation Consultation Note  Patient Name: Andrea Bradford RUEAV'WToday's Date: 08/14/2018 Reason for consult: Initial assessment;1st time breastfeeding;Term  Visited patient for the first time to offer breast feeding assistance. Mom reports that she has successfully breast fed several times since delivery. She states that she and baby were sleepy last night, but this morning baby has been consistently feeding every 2-3 hours.  I educated mom on day one infant feeding patterns, feeding cues, feeding frequency and duration and output expectations.  I offered to assist mom with breast feeding baby,and she consented. I first taught her how to hand express, and we noted colostrum.   I assisted with positioning of baby on mom's left breast in cross cradle hold. I showed mom how to do a "U" hold to compress breast for deeper latch. Baby latched easily with good rhythmic suckling sequences noted. Mom denied breast pain. I showed her how to gently tug baby's chin to flange lower lip.  I observed baby feeding for approximately 10-15 minutes and left room while baby was still actively feeding. I recommended that she feed baby 8-12 times/day on demand. I suggested that she avoid artificial nipples and pacifiers for three weeks unless medically indicated.  Mom verbalized understanding. I suggested that she call for assistance or if she has a question as needed.  Mom has a personal (Medela) pump.   Maternal Data Has patient been taught Hand Expression?: Yes Does the patient have breastfeeding experience prior to this delivery?: No  Feeding Feeding Type: Breast Fed  LATCH Score Latch: Grasps breast easily, tongue down, lips flanged, rhythmical sucking.  Audible Swallowing: A few with stimulation  Type of Nipple: Everted at rest and after stimulation  Comfort (Breast/Nipple): Soft / non-tender  Hold (Positioning): Assistance needed to correctly position  infant at breast and maintain latch.  LATCH Score: 8  Interventions Interventions: Breast compression;Breast feeding basics reviewed;Assisted with latch;Hand express  Lactation Tools Discussed/Used    Consult Status Consult Status: Follow-up Date: 08/15/18 Follow-up type: In-patient    Walker ShadowHeather Kaegan Stigler 08/14/2018, 2:09 PM

## 2018-08-14 NOTE — Progress Notes (Signed)
At one hour check, pt's fundus was deviated to the left and had risen to the level of the umbilicus. Assisted pt to the restroom at this time to void. Pt voided without difficulty. Once patient was back in the bed, fundus was firm, midline and 1 below the umbilicus. Will continue to monitor.

## 2018-08-14 NOTE — Anesthesia Postprocedure Evaluation (Signed)
Anesthesia Post Note  Patient: Andrea Bradford  Procedure(s) Performed: AN AD HOC LABOR EPIDURAL     Patient location during evaluation: Mother Baby Anesthesia Type: Epidural Level of consciousness: awake and alert and oriented Pain management: satisfactory to patient Vital Signs Assessment: post-procedure vital signs reviewed and stable Respiratory status: respiratory function stable Cardiovascular status: stable Postop Assessment: no headache, no backache, epidural receding, patient able to bend at knees, no signs of nausea or vomiting and adequate PO intake Anesthetic complications: no    Last Vitals:  Vitals:   08/14/18 0225 08/14/18 0359  BP: 112/70 104/67  Pulse: 79 76  Resp: 16 18  Temp: 37.6 C 37.2 C  SpO2: 100% 99%    Last Pain:  Vitals:   08/14/18 0359  TempSrc:   PainSc: 0-No pain   Pain Goal:                 Isa Kohlenberg

## 2018-08-14 NOTE — Progress Notes (Signed)
PPD 1 SVD with labial repairs  S:  Reports feeling ok - "tired and little sore but not too bad"             Tolerating po/ No nausea or vomiting             Bleeding is light             Pain controlled with Motrin             Up ad lib / ambulatory / voiding QS  Newborn Breast  O:    VS: BP 127/74 (BP Location: Right Arm)   Pulse 68   Temp 98.8 F (37.1 C) (Oral)   Resp 18   Ht 5\' 6"  (1.676 m)   Wt 82.1 kg   SpO2 100%   Breastfeeding Unknown   BMI 29.21 kg/m    LABS:             Recent Labs    08/13/18 1059 08/14/18 0530  WBC 10.3 17.6*  HGB 10.2* 9.1*  PLT 247 203               Blood type:A pos  Rubella: Immune (05/07 0000)                     I&O: Intake/Output      12/15 0701 - 12/16 0700 12/16 0701 - 12/17 0700   Urine (mL/kg/hr) 1800    Blood 918    Total Output 2718    Net -2718                    Physical Exam:             Alert and oriented X3  Fundus: firm, non-tender, Ueven  Perineum: ice pack in place  Lochia: light  Extremities: no edema, no calf pain or tenderness   A: PPD # 1 SVD with manual placental removal and labial repairs   Doing well - stable status  P: Routine post partum orders  Iron and magnesium             DC in am with newborn  Andrea Bradford CNM, MSN, [facility] 08/14/2018, 10:41 AM

## 2018-08-14 NOTE — Lactation Note (Signed)
This note was copied from a baby's chart. Lactation Consultation Note  Patient Name: Andrea Bradford AVWUJ'WToday's Date: 08/14/2018 Reason for consult: Follow-up assessment;Difficult latch;Mother's request;Term;Primapara;1st time breastfeeding;Nipple pain/trauma  P1 mother whose infant is now 6419 hours old.  Mother requested latch assistance.  Offered to assist with latching and mother accepted.  Mother's breasts are soft and non tender.  Her nipples are bright red, irritated and painful.  Allowed infant to suck on my gloved finger for an oral assessment and it appears that she has a labial tongue tie.  She was able to extend tongue slightly over lower gum line a couple of times but tongue stayed back behind lower ridge and high to upper palate.  Explained to mother the goal in latching baby to breast.  The first latch attempt was in the cross cradle hold on the right breast.  Baby was able to latch and tongue was in correct position but mother felt pain.  A chin tug did not resolve the pain.  Attempted a second time with the same result.   Suggested mother try the football hold on the left breast and mother willing to try.  Again, baby was able to latch and mother stated this felt "much better" although she was still very sensitive.  Baby sucked well for a few minutes.  Chin tug performed and mother still sensitive but stated it felt much better than her right side.  I suggested we treat her nipples and begin pumping with the DEBP to give her nipples a rest.  Mother agreed.  Educated mother to use EBM to rub into nipple/areola after feedings.  Coconut oil and comfort gels with instructions also provided.  Initiated the DEBP.  Pump parts, assembly, disassembly and cleaning reviewed.  #24 flange size is appropriate at this time.  Mother denied pain with pumping.  Demonstrated finger feeding and mother knows to feed back any EBM she obtains with pumping and hand expression.  Encouraged to feed baby 8-12  times/24 hours or sooner if she shows feeding cues.  Mother will awaken baby every three hours if she remains sleeping.    Mother has a DEBP for home use and will return to work in 12 weeks.  Father present and supportive.  Mother will call for assistance as needed.  RN updated.   Maternal Data Formula Feeding for Exclusion: No Has patient been taught Hand Expression?: Yes Does the patient have breastfeeding experience prior to this delivery?: No  Feeding Feeding Type: Breast Fed  LATCH Score Latch: Repeated attempts needed to sustain latch, nipple held in mouth throughout feeding, stimulation needed to elicit sucking reflex.  Audible Swallowing: None  Type of Nipple: Everted at rest and after stimulation(reddened and irritated)  Comfort (Breast/Nipple): Filling, red/small blisters or bruises, mild/mod discomfort  Hold (Positioning): Assistance needed to correctly position infant at breast and maintain latch.  LATCH Score: 5  Interventions Interventions: Breast feeding basics reviewed;Assisted with latch;Skin to skin;Breast massage;Hand express;Breast compression;Comfort gels;Coconut oil;Position options;Support pillows;Adjust position;DEBP  Lactation Tools Discussed/Used Tools: Pump;Coconut oil;Comfort gels WIC Program: No Pump Review: Setup, frequency, and cleaning;Milk Storage Initiated by:: Aja Whitehair Date initiated:: 08/15/18   Consult Status Consult Status: Follow-up Date: 08/15/18 Follow-up type: In-patient    Andrea Bradford 08/14/2018, 7:11 PM

## 2018-08-15 MED ORDER — ACETAMINOPHEN 325 MG PO TABS: 650.0000 mg | ORAL_TABLET | ORAL | Status: DC | PRN

## 2018-08-15 MED ORDER — BENZOCAINE-MENTHOL 20-0.5 % EX AERO: 1.0000 "application " | INHALATION_SPRAY | CUTANEOUS | Status: DC | PRN

## 2018-08-15 MED ORDER — POLYSACCHARIDE IRON COMPLEX 150 MG PO CAPS: 150.0000 mg | ORAL_CAPSULE | Freq: Every day | ORAL | Status: DC

## 2018-08-15 MED ORDER — MAGNESIUM OXIDE 400 (241.3 MG) MG PO TABS: 400.0000 mg | ORAL_TABLET | Freq: Every day | ORAL | Status: DC

## 2018-08-15 MED ORDER — IBUPROFEN 600 MG PO TABS: 600.0000 mg | ORAL_TABLET | Freq: Four times a day (QID) | ORAL | 0 refills | Status: DC

## 2018-08-15 MED ORDER — COCONUT OIL OIL: 1.0000 "application " | TOPICAL_OIL | 0 refills | Status: DC | PRN

## 2018-08-15 NOTE — Lactation Note (Signed)
This note was copied from a baby's chart. Lactation Consultation Note  Patient Name: Andrea Bradford MVHQI'OToday's Date: 08/15/2018 Reason for consult: Follow-up assessment;Mother's request;Difficult latch P1, 27 hour female infant. Per parents, infant is cluster feeding. Mom is starting to feel more confident with breastfeeding especially now that she sees that she has colostrum from hand expression. Mom having problems with latching infant to breast due labial tongue tie. Mom breastfeed infant in chair using cross cradle hold reports breast are sore, infant had deep latch nose to breast and swallowing was heard by LC.  Mom stated latch was not painful like before but breast is sore from previous latches with bruising on breast. Infant breastfeeding for 20 minutes and still Breastfeeding as LC left room. Mom hand express and gave infant 6 ml of colostrum on spoon. LC notice that only mom's right breast is short shafted, LC gave mom breast shells to wear to help elongate nipple shaft out more.  Mom will breastfeed according hunger cues, 8 to 12 times within 24 hours including nights and mom will not exceed 3 hour without breastfeeding infant.  Mom plans to breastfeed infant , then hand express or use DEBP and give infant back volume. Mom knows to ask Nurse or [facility] for assistance  if she has  any further questions ,concerns or needs help with latching infant to breast.     Feeding    LATCH Score Latch: Grasps breast easily, tongue down, lips flanged, rhythmical sucking.  Audible Swallowing: Spontaneous and intermittent  Type of Nipple: Everted at rest and after stimulation(only left breast is short shafted )  Comfort (Breast/Nipple): Filling, red/small blisters or bruises, mild/mod discomfort  Hold (Positioning): Assistance needed to correctly position infant at breast and maintain latch.  LATCH Score: 8  Interventions Interventions: Assisted with latch;Shells;Expressed milk;Hand  express;Support pillows;Adjust position  Lactation Tools Discussed/Used Tools: Shells Shell Type: Other (comment)(Left  breast is short shafted )   Consult Status Consult Status: Follow-up Date: 08/15/18 Follow-up type: In-patient    Andrea Bradford 08/15/2018, 3:32 AM

## 2018-08-15 NOTE — Discharge Summary (Signed)
OB Discharge Summary  Patient Name: Andrea Bradford DOB: Apr 15, 1981 MRN: [id number]  Date of admission: 08/13/2018 Delivering provider: Neta Mends   Date of discharge: 08/15/2018  Admitting diagnosis: Coming to see Mid-Wife for test Intrauterine pregnancy: [redacted]w[redacted]d     Secondary diagnosis:Principal Problem:   SVD 12/15 Active Problems:   Pregnancy / IVF   PROM (premature rupture of membranes)   Prolonged latent phase of labor   Postpartum care following vaginal delivery   Obstetrical laceration - bilateral labial splays   Iron deficiency anemia of pregnancy  Additional problems:none      Discharge diagnosis:  Patient Active Problem List   Diagnosis Date Noted  . SVD 12/15 08/14/2018  . Postpartum care following vaginal delivery 08/14/2018  . Obstetrical laceration - bilateral labial splays 08/14/2018  . Iron deficiency anemia of pregnancy 08/14/2018  . Pregnancy / IVF 08/13/2018  . PROM (premature rupture of membranes) 08/13/2018  . Prolonged latent phase of labor 08/13/2018                                                                Post partum procedures:none  Augmentation: Pitocin, Cytotec and Foley Balloon Pain control: Epidural;Local  Laceration:Labial  Episiotomy:None  Complications: manual removal of retained placenta  Hospital course:  Induction of Labor With Vaginal Delivery   37 y.o. yo G2P1011 at [redacted]w[redacted]d was admitted to the hospital 08/13/2018 for induction of labor.  Indication for induction: PROM.  Patient had a  labor course as follows: Membrane Rupture Time/Date: 3:00 PM ,08/12/2018  , Cytotec 50 mcg x 1 dose and cervical balloon to 7 cm progress relatively fast, then protracted labor with minimal change to 8 cm over several hours despite hydrotherapy, attempt at nitrous oxide, good labor support by doula and spouse, requiring epidural and Pitocin augmentation with internal pressure monitor placement to progress to complete. Intrapartum Procedures:  Episiotomy: None [1]                                         Lacerations:  Labial [10]  Patient had delivery of a Viable infant.  Information for the patient's newborn:  Juri, Dinning Girl Della [[id number]]      08/13/2018  Details of delivery can be found in separate delivery note.  Patient had a routine postpartum course. Patient is discharged home 08/15/18.  Physical exam  Vitals:   08/14/18 1423 08/14/18 2100 08/14/18 2324 08/15/18 0612  BP: 115/60 (!) 88/61 (!) 86/47 (!) 91/52  Pulse: 72 83 67 60  Resp: 17 18 14 12   Temp: 98.4 F (36.9 C) 97.8 F (36.6 C) 98.3 F (36.8 C) 98.2 F (36.8 C)  TempSrc: Oral Oral Oral Oral  SpO2: 93% 98% 99% 92%  Weight:      Height:       General: alert, cooperative and no distress Lochia: appropriate Uterine Fundus: firm Incision: Healing well with no significant drainage DVT Evaluation: No cords or calf tenderness. No significant calf/ankle edema. Labs: Lab Results  Component Value Date   WBC 17.6 (H) 08/14/2018   HGB 9.1 (L) 08/14/2018   HCT 28.3 (L) 08/14/2018   MCV 81.1 08/14/2018   PLT 203 08/14/2018  No flowsheet data found.   Discharge instruction: per After Visit Summary and "Baby and Me Booklet".  After Visit Meds:  Allergies as of 08/15/2018   No Known Allergies     Medication List    TAKE these medications   acetaminophen 325 MG tablet Commonly known as:  TYLENOL Take 2 tablets (650 mg total) by mouth every 4 (four) hours as needed (for pain scale < 4).   benzocaine-Menthol 20-0.5 % Aero Commonly known as:  DERMOPLAST Apply 1 application topically as needed for irritation (perineal discomfort).   coconut oil Oil Apply 1 application topically as needed.   ibuprofen 600 MG tablet Commonly known as:  ADVIL,MOTRIN Take 1 tablet (600 mg total) by mouth every 6 (six) hours.   iron polysaccharides 150 MG capsule Commonly known as:  NIFEREX Take 1 capsule (150 mg total) by mouth daily. Start taking on:   August 16, 2018   magnesium oxide 400 (241.3 Mg) MG tablet Commonly known as:  MAG-OX Take 1 tablet (400 mg total) by mouth daily. Start taking on:  August 16, 2018   prenatal multivitamin Tabs tablet Take 1 tablet by mouth daily at 12 noon.            Discharge Care Instructions  (From admission, onward)         Start     Ordered   08/15/18 0000  Discharge wound care:    Comments:  Sitz baths 2 times /day with warm water x 1 week   08/15/18 1403          Diet: routine diet  Activity: Advance as tolerated. Pelvic rest for 6 weeks.   Postpartum contraception: Not Discussed  Newborn Data: Live born female  Birth Weight: 7 lb 15.3 oz (3610 g) APGAR: 8, 9  Newborn Delivery   Birth date/time:  08/13/2018 23:45:00 Delivery type:  Vaginal, Spontaneous     named Teresa AustriaDominica Baby Feeding: Breast Disposition:home with mother   Delivery Report:  Review the Delivery Report for details.    Follow up: Follow-up Information    Neta Mendsaul, Gabrien Mentink C, CNM. Schedule an appointment as soon as possible for a visit in 2 week(s).   Specialty:  Obstetrics and Gynecology Why:  Postpartum visit Contact information: [address] [place] [id number] [phone]             Signed: Neta MendsDaniela C Jenilee Franey, CNM, MSN 08/15/2018, 2:04 PM

## 2019-08-31 NOTE — L&D Delivery Note (Signed)
Delivery Note:   G3P1011 at [redacted]w[redacted]d  Admitting diagnosis: Encounter for induction of labor [Z34.90] Risks: IVF, AMA, Anemia, anxiety Onset of labor:  04/01/2020 outpatient CB,  04/02/2020 cytotec 50 mcg buccal at 1730 IOL/Augmentation: AROM, Cytotec and OP Foley ROM: 0058 04/03/2020 clear AF  Complete dilation at 04/03/2020  0132 Onset of pushing at 0132 FHR second stage Cat 1  Analgesia /Anesthesia intrapartum:None   Used labor support and hydrotherapy until transition stage of labor Pushing in L and R lateral position with CNM and L&D staff support, Trey Paula and Maralyn Sago (doula) present for birth and supportive.  Delivery of a Live born female  Birth Weight:  3966 g Weight:, English: 8 lb 11.9 oz APGAR: 8 , 9  Newborn Delivery   Birth date/time: 04/03/2020 01:54:00 Delivery type: Vaginal, Spontaneous      in cephalic presentation, position OA to LOT.  Nuchal Cord: x1, baby sumersaulted on perineum Cord double clamped after cessation of pulsation, cut by CNM.  Collection of cord blood for typing completed. Cord blood donation- no Arterial cord blood sample-  no  Placenta delivered-Spontaneous  with 3VC, partial separation with brisk bleeding, placenta delivered with LUS massage and gentle cord traction. Intermittent atony resolved with massage and following meds  . Uterotonics: Pitocin IM, cytotec buccal 400 mcg and rectal 400 mcg, Pitocin IV bolus  Placenta to patient per request. Uterine tone firm after interventions, LUS swept for additional clots, bleeding small    L labia minora laceration, transverse section identified.  Episiotomy:None  Local analgesia: 1% lido  Repair: 3.0 vicryl Est. Blood Loss (mL): 1030cc  Complications: Hemorrhage>1068mL   Mom to postpartum.  Baby to Couplet care / Skin to Skin.  Delivery Report:  Review the Delivery Report for details.     Signed: Neta Mends, CNM, MSN 04/03/2020, 2:29 AM

## 2019-09-14 LAB — OB RESULTS CONSOLE HEPATITIS B SURFACE ANTIGEN: Hepatitis B Surface Ag: NEGATIVE

## 2019-09-14 LAB — OB RESULTS CONSOLE RPR: RPR: NONREACTIVE

## 2019-09-14 LAB — OB RESULTS CONSOLE RUBELLA ANTIBODY, IGM: Rubella: IMMUNE

## 2019-09-14 LAB — OB RESULTS CONSOLE HIV ANTIBODY (ROUTINE TESTING): HIV: NONREACTIVE

## 2019-09-14 LAB — OB RESULTS CONSOLE ABO/RH: RH Type: POSITIVE

## 2019-09-14 LAB — OB RESULTS CONSOLE ANTIBODY SCREEN: Antibody Screen: NEGATIVE

## 2020-01-21 ENCOUNTER — Other Ambulatory Visit: Payer: Self-pay | Admitting: Obstetrics and Gynecology

## 2020-01-22 ENCOUNTER — Encounter (HOSPITAL_COMMUNITY): Payer: Self-pay

## 2020-01-22 ENCOUNTER — Encounter (HOSPITAL_COMMUNITY)
Admission: RE | Admit: 2020-01-22 | Discharge: 2020-01-22 | Disposition: A | Discharge status: Home / Self Care | Payer: No Typology Code available for payment source | Source: Ambulatory Visit | Attending: Obstetrics and Gynecology | Admitting: Obstetrics and Gynecology

## 2020-01-22 ENCOUNTER — Other Ambulatory Visit: Payer: Self-pay

## 2020-01-22 DIAGNOSIS — Z3A00 Weeks of gestation of pregnancy not specified: Secondary | ICD-10-CM | POA: Diagnosis not present

## 2020-01-22 DIAGNOSIS — D649 Anemia, unspecified: Secondary | ICD-10-CM | POA: Diagnosis not present

## 2020-01-22 DIAGNOSIS — O99013 Anemia complicating pregnancy, third trimester: Secondary | ICD-10-CM | POA: Diagnosis present

## 2020-01-22 HISTORY — DX: Anemia, unspecified: D64.9

## 2020-01-22 MED ORDER — SODIUM CHLORIDE 0.9 % IV SOLN
750.0000 mg | Freq: Once | INTRAVENOUS | Status: AC
  Administered 2020-01-22: 750 mg via INTRAVENOUS
  Filled 2020-01-22: qty 15

## 2020-01-22 MED ORDER — SODIUM CHLORIDE 0.9 % IV SOLN: Freq: Once | INTRAVENOUS | Status: AC

## 2020-01-29 ENCOUNTER — Encounter (HOSPITAL_COMMUNITY)
Admission: RE | Admit: 2020-01-29 | Discharge: 2020-01-29 | Disposition: A | Discharge status: Home / Self Care | Payer: No Typology Code available for payment source | Source: Ambulatory Visit | Attending: Obstetrics and Gynecology | Admitting: Obstetrics and Gynecology

## 2020-01-29 ENCOUNTER — Encounter (HOSPITAL_COMMUNITY): Payer: Self-pay

## 2020-01-29 ENCOUNTER — Other Ambulatory Visit: Payer: Self-pay

## 2020-01-29 DIAGNOSIS — Z3A00 Weeks of gestation of pregnancy not specified: Secondary | ICD-10-CM | POA: Diagnosis not present

## 2020-01-29 DIAGNOSIS — O99013 Anemia complicating pregnancy, third trimester: Secondary | ICD-10-CM | POA: Diagnosis present

## 2020-01-29 DIAGNOSIS — D649 Anemia, unspecified: Secondary | ICD-10-CM | POA: Insufficient documentation

## 2020-01-29 MED ORDER — SODIUM CHLORIDE 0.9 % IV SOLN
750.0000 mg | Freq: Once | INTRAVENOUS | Status: AC
  Administered 2020-01-29: 750 mg via INTRAVENOUS
  Filled 2020-01-29: qty 15

## 2020-01-29 MED ORDER — SODIUM CHLORIDE 0.9 % IV SOLN: Freq: Once | INTRAVENOUS | Status: AC

## 2020-03-11 LAB — OB RESULTS CONSOLE GBS: GBS: NEGATIVE

## 2020-03-25 ENCOUNTER — Encounter (HOSPITAL_COMMUNITY): Payer: Self-pay | Admitting: *Deleted

## 2020-03-25 ENCOUNTER — Telehealth (HOSPITAL_COMMUNITY): Payer: Self-pay | Admitting: *Deleted

## 2020-03-25 NOTE — Telephone Encounter (Signed)
Preadmission screen  

## 2020-03-26 ENCOUNTER — Telehealth (HOSPITAL_COMMUNITY): Payer: Self-pay | Admitting: *Deleted

## 2020-03-26 ENCOUNTER — Encounter (HOSPITAL_COMMUNITY): Payer: Self-pay | Admitting: *Deleted

## 2020-03-26 NOTE — Telephone Encounter (Signed)
Preadmission screen  

## 2020-03-31 ENCOUNTER — Other Ambulatory Visit (HOSPITAL_COMMUNITY)
Admission: RE | Admit: 2020-03-31 | Discharge: 2020-03-31 | Disposition: A | Discharge status: Home / Self Care | Payer: No Typology Code available for payment source | Source: Ambulatory Visit | Attending: Obstetrics and Gynecology | Admitting: Obstetrics and Gynecology

## 2020-03-31 ENCOUNTER — Other Ambulatory Visit: Payer: Self-pay

## 2020-03-31 DIAGNOSIS — Z20822 Contact with and (suspected) exposure to covid-19: Secondary | ICD-10-CM | POA: Insufficient documentation

## 2020-03-31 DIAGNOSIS — Z01812 Encounter for preprocedural laboratory examination: Secondary | ICD-10-CM | POA: Insufficient documentation

## 2020-03-31 DIAGNOSIS — O26893 Other specified pregnancy related conditions, third trimester: Secondary | ICD-10-CM | POA: Diagnosis not present

## 2020-03-31 LAB — SARS CORONAVIRUS 2 (TAT 6-24 HRS): SARS Coronavirus 2: NEGATIVE

## 2020-04-02 ENCOUNTER — Inpatient Hospital Stay (HOSPITAL_COMMUNITY): Payer: No Typology Code available for payment source

## 2020-04-02 ENCOUNTER — Encounter (HOSPITAL_COMMUNITY): Payer: Self-pay

## 2020-04-02 ENCOUNTER — Other Ambulatory Visit: Payer: Self-pay

## 2020-04-02 ENCOUNTER — Inpatient Hospital Stay (HOSPITAL_COMMUNITY)
Admission: AD | Admit: 2020-04-02 | Discharge: 2020-04-04 | DRG: 805 | Disposition: A | Discharge status: Home / Self Care | Payer: No Typology Code available for payment source

## 2020-04-02 DIAGNOSIS — O9081 Anemia of the puerperium: Secondary | ICD-10-CM | POA: Diagnosis not present

## 2020-04-02 DIAGNOSIS — Z3A40 40 weeks gestation of pregnancy: Secondary | ICD-10-CM

## 2020-04-02 DIAGNOSIS — Z3490 Encounter for supervision of normal pregnancy, unspecified, unspecified trimester: Secondary | ICD-10-CM

## 2020-04-02 DIAGNOSIS — O4593 Premature separation of placenta, unspecified, third trimester: Secondary | ICD-10-CM | POA: Diagnosis present

## 2020-04-02 DIAGNOSIS — D62 Acute posthemorrhagic anemia: Secondary | ICD-10-CM | POA: Diagnosis not present

## 2020-04-02 DIAGNOSIS — O26893 Other specified pregnancy related conditions, third trimester: Secondary | ICD-10-CM | POA: Diagnosis present

## 2020-04-02 DIAGNOSIS — Z20822 Contact with and (suspected) exposure to covid-19: Secondary | ICD-10-CM | POA: Diagnosis present

## 2020-04-02 DIAGNOSIS — D509 Iron deficiency anemia, unspecified: Secondary | ICD-10-CM | POA: Diagnosis present

## 2020-04-02 LAB — CBC
HCT: 35.9 % — ABNORMAL LOW (ref 36.0–46.0)
Hemoglobin: 11.7 g/dL — ABNORMAL LOW (ref 12.0–15.0)
MCH: 28.9 pg (ref 26.0–34.0)
MCHC: 32.6 g/dL (ref 30.0–36.0)
MCV: 88.6 fL (ref 80.0–100.0)
Platelets: 192 10*3/uL (ref 150–400)
RBC: 4.05 MIL/uL (ref 3.87–5.11)
RDW: 20.2 % — ABNORMAL HIGH (ref 11.5–15.5)
WBC: 7.6 10*3/uL (ref 4.0–10.5)
nRBC: 0 % (ref 0.0–0.2)

## 2020-04-02 LAB — TYPE AND SCREEN
ABO/RH(D): A POS
Antibody Screen: NEGATIVE

## 2020-04-02 MED ORDER — ACETAMINOPHEN 325 MG PO TABS: 650.0000 mg | ORAL_TABLET | ORAL | Status: DC | PRN

## 2020-04-02 MED ORDER — OXYTOCIN 10 UNIT/ML IJ SOLN
10.0000 [IU] | Freq: Once | INTRAMUSCULAR | Status: AC
  Administered 2020-04-03: 10 [IU] via INTRAMUSCULAR
  Filled 2020-04-02: qty 1

## 2020-04-02 MED ORDER — SOD CITRATE-CITRIC ACID 500-334 MG/5ML PO SOLN: 30.0000 mL | ORAL | Status: DC | PRN

## 2020-04-02 MED ORDER — LIDOCAINE HCL (PF) 1 % IJ SOLN
30.0000 mL | INTRAMUSCULAR | Status: AC | PRN
  Administered 2020-04-03: 30 mL via SUBCUTANEOUS
  Filled 2020-04-02: qty 30

## 2020-04-02 MED ORDER — MISOPROSTOL 50MCG HALF TABLET
50.0000 ug | ORAL_TABLET | ORAL | Status: DC | PRN
  Administered 2020-04-02: 50 ug via BUCCAL
  Filled 2020-04-02: qty 1

## 2020-04-02 MED ORDER — ONDANSETRON HCL 4 MG/2ML IJ SOLN: 4.0000 mg | Freq: Four times a day (QID) | INTRAMUSCULAR | Status: DC | PRN

## 2020-04-02 MED ORDER — TERBUTALINE SULFATE 1 MG/ML IJ SOLN: 0.2500 mg | Freq: Once | INTRAMUSCULAR | Status: DC | PRN

## 2020-04-02 NOTE — Progress Notes (Signed)
Andrea Bradford is a 39 y.o. G3P1011 at [redacted]w[redacted]d by ultrasound admitted for IOL at term Cervical balloon outpatient, one dose Cytotec 50 mcg buccal at 1730  Subjective: Feeling more pressure w/ ctx since lateral positioning w/ peanut ball and repeat membrane sweep at 2100. Occasional rectal pressure.  No LOF or VB.   Objective: Vitals:   04/02/20 1924 04/02/20 2010 04/02/20 2120 04/02/20 2228  BP: (!) 104/56 114/60 (!) 95/56 109/67  Pulse: 82 76 79 72  Resp: 18 20 18 20   Temp:   98.3 F (36.8 C)   TempSrc:   Oral   Weight:      Height:         FHT:  FHR: 140 bpm, variability: moderate,  accelerations:  Present,  decelerations:  Absent UC:   regular, every 3-4 minutes SVE:   Dilation: 7 Effacement (%): 80 Station: -1 Exam by:: [email or web address], RN IBOW Labs:   Recent Labs    04/02/20 1811  WBC 7.6  HGB 11.7*  HCT 35.9*  PLT 192    Assessment / Plan: G3P1011 39 y.o. [redacted]w[redacted]d Induction of labor due to term, IVF Physiologic labor post 1 dose cytotec, regular ctx and cervical effacement.   Labor: Will do water immersion trial, hydrotherapy Preeclampsia:  no signs or symptoms of toxicity Fetal Wellbeing:  Category I Pain Control:  Labor support without medications and Water tub I/D:  GBS neg Anticipated MOD:  NSVD  [redacted]w[redacted]d, CNM, MSN 04/02/2020, 11:04 PM

## 2020-04-02 NOTE — H&P (Signed)
OB ADMISSION/ HISTORY & PHYSICAL:  Admission Date: 04/02/2020  4:27 PM  Admit Diagnosis: Encounter for induction of labor [Z34.90]    Andrea Bradford is a 39 y.o. female presenting for IOL at term. Cervical balloon placed in the office yesterday with expulsion last night. States she has been having irregular contractions since balloon placement. Denies leaking of fluid or vaginal bleeding. Endorses + fetal movement but less than usual.   Desires low intervention, unmedicated birth. Plans hydrotherapy in active labor with birth on land. Spouse present and supportive. Doula will arrive in active labor.   Prenatal History: G3P1011   EDC : 04/03/2020, by Other Basis  Prenatal care at [facility] since 1st trimester   Prenatal course complicated by: 1. IVF pregnancy 2. AMA 3. Anemia, hemoglobin 8.5 at 28 weeks, treated with IV feraheme x 2, last hemoglobin on 7/21 was 10.9 4. Mild echogenic bowel and EIF bilaterally on anatomy scan, normal Panorama, pt declined further U/S follow-up 5. Vegetarian 6. Anxiety, stable off medication 7. History of retained placenta and manual removal, no PPH  Prenatal Labs: ABO, Rh: A (01/15 0000)  Antibody: NEG (08/04 1811) Rubella: Immune (01/15 0000)  RPR: Nonreactive (01/15 0000)  HBsAg: Negative (01/15 0000)  HIV: Non-reactive (01/15 0000)  GBS: Negative/-- (07/13 0000)  1 hr Glucola : 134 Genetic Screening: Normal Panorama and AFP1 Ultrasound: AGA, posterior placenta, normal anatomy with EIF and ECB    Maternal Diabetes: No Genetic Screening: Normal Maternal Ultrasounds/Referrals: Normal Fetal Ultrasounds or other Referrals:  None Maternal Substance Abuse:  No Significant Maternal Medications:  None Significant Maternal Lab Results:  Group B Strep negative Other Comments:  None  Medical / Surgical History : Past medical history:  Past Medical History:  Diagnosis Date  . Anemia   . History of chlamydia   . Vaginal Pap smear, abnormal    high risk  HPV    Past surgical history:  Past Surgical History:  Procedure Laterality Date  . BREAST ENHANCEMENT SURGERY    . DILATION AND CURETTAGE OF UTERUS    . FOOT SURGERY  2010  . HAND SURGERY  2015  . NOSE SURGERY     1998 and 2018    Family History:  Family History  Problem Relation Age of Onset  . Cancer Mother   . Breast cancer Mother   . Cancer Sister   . Breast cancer Sister   . Lung cancer Brother   . Seizures Brother   . Breast cancer Maternal Aunt   . Lung cancer Maternal Grandfather   . Breast cancer Paternal Grandmother   . Lung cancer Paternal Grandfather     Social History:  reports that she has never smoked. She has never used smokeless tobacco. She reports that she does not drink alcohol and does not use drugs.  Allergies: Patient has no known allergies.   Current Medications at time of admission:  Medications Prior to Admission  Medication Sig Dispense Refill Last Dose  . acetaminophen (TYLENOL) 325 MG tablet Take 2 tablets (650 mg total) by mouth every 4 (four) hours as needed (for pain scale < 4).     . benzocaine-Menthol (DERMOPLAST) 20-0.5 % AERO Apply 1 application topically as needed for irritation (perineal discomfort).     . coconut oil OIL Apply 1 application topically as needed.  0   . ibuprofen (ADVIL,MOTRIN) 600 MG tablet Take 1 tablet (600 mg total) by mouth every 6 (six) hours. 30 tablet 0   . iron polysaccharides (NIFEREX)  150 MG capsule Take 1 capsule (150 mg total) by mouth daily.     . magnesium oxide (MAG-OX) 400 (241.3 Mg) MG tablet Take 1 tablet (400 mg total) by mouth daily.     . Prenatal Vit-Fe Fumarate-FA (PRENATAL MULTIVITAMIN) TABS tablet Take 1 tablet by mouth daily at 12 noon.       Review of Systems: Review of Systems  All other systems reviewed and are negative.  Physical Exam: Vital signs and nursing notes reviewed.  Patient Vitals for the past 24 hrs:  BP Temp Temp src Pulse Resp Height Weight  04/02/20 1924 (!) 104/56 --  -- 82 18 -- --  04/02/20 1822 117/68 -- -- 75 20 -- --  04/02/20 1719 103/64 98.6 F (37 C) Oral 84 18 -- --  04/02/20 1714 -- -- -- -- -- 5\' 6"  (1.676 m) 82.6 kg    General: AAO x 3, NAD Heart: RRR Lungs:CTAB Abdomen: Gravid, NT, Leopold's fetal spine on left, vertex Extremities: no edema Genitalia / VE: Dilation: 6 Effacement (%): 70 Station: -1 Presentation: Vertex Exam by:: [name],CNM   FHR: 135 BPM, mod variability, + accels, no decels TOCO: Ctx irregular  Labs:   Pending T&S, CBC, RPR  Recent Labs    04/02/20 1811  WBC 7.6  HGB 11.7*  HCT 35.9*  PLT 192   Assessment:  39 y.o. G3P1011 at [redacted]w[redacted]d, IVF pregnancy, AMA  1. Induction stage of labor, successful cervical ripening with balloon 2. FHR category 1 3. GBS negative 4. Desires hydrotherapy, low intervention, declines IV placement and has been counseled regarding R/B/A of IV placement in labor 5. Breastfeeding 6. Placenta disposal per patient request 7. Anxiety state, stable off meds  Plan:  1. Admit to BS 2. Routine L&D orders 3. Analgesia/anesthesia PRN  4. Continue IOL with membrane sweep and buccal Cytotec [redacted]w[redacted]d, AROM when favorable 5. Anticipate NSVB 6. Close f/u in office at 2 weeks postpartum for PPD assessment  Dr. notified of admission / plan of care   Algie Coffer CNM, MSN 04/02/2020, 7:40 PM

## 2020-04-03 ENCOUNTER — Encounter (HOSPITAL_COMMUNITY): Payer: Self-pay

## 2020-04-03 DIAGNOSIS — D62 Acute posthemorrhagic anemia: Secondary | ICD-10-CM | POA: Diagnosis not present

## 2020-04-03 DIAGNOSIS — Z3490 Encounter for supervision of normal pregnancy, unspecified, unspecified trimester: Secondary | ICD-10-CM | POA: Diagnosis present

## 2020-04-03 LAB — CBC
HCT: 29 % — ABNORMAL LOW (ref 36.0–46.0)
HCT: 23.9 % — ABNORMAL LOW (ref 36.0–46.0)
Hemoglobin: 9.4 g/dL — ABNORMAL LOW (ref 12.0–15.0)
Hemoglobin: 7.9 g/dL — ABNORMAL LOW (ref 12.0–15.0)
MCH: 29.5 pg (ref 26.0–34.0)
MCH: 29.4 pg (ref 26.0–34.0)
MCHC: 32.4 g/dL (ref 30.0–36.0)
MCHC: 33.1 g/dL (ref 30.0–36.0)
MCV: 90.9 fL (ref 80.0–100.0)
MCV: 88.8 fL (ref 80.0–100.0)
Platelets: 195 10*3/uL (ref 150–400)
Platelets: 161 10*3/uL (ref 150–400)
RBC: 3.19 MIL/uL — ABNORMAL LOW (ref 3.87–5.11)
RBC: 2.69 MIL/uL — ABNORMAL LOW (ref 3.87–5.11)
RDW: 19.9 % — ABNORMAL HIGH (ref 11.5–15.5)
RDW: 19.8 % — ABNORMAL HIGH (ref 11.5–15.5)
WBC: 15.3 10*3/uL — ABNORMAL HIGH (ref 4.0–10.5)
WBC: 11.5 10*3/uL — ABNORMAL HIGH (ref 4.0–10.5)
nRBC: 0 % (ref 0.0–0.2)
nRBC: 0 % (ref 0.0–0.2)

## 2020-04-03 LAB — RPR: RPR Ser Ql: NONREACTIVE

## 2020-04-03 MED ORDER — MISOPROSTOL 200 MCG PO TABS
400.0000 ug | ORAL_TABLET | Freq: Once | ORAL | Status: AC
  Administered 2020-04-03: 400 ug via BUCCAL

## 2020-04-03 MED ORDER — ONDANSETRON HCL 4 MG PO TABS: 4.0000 mg | ORAL_TABLET | ORAL | Status: DC | PRN

## 2020-04-03 MED ORDER — MISOPROSTOL 200 MCG PO TABS: 400.0000 ug | ORAL_TABLET | Freq: Once | ORAL | Status: AC

## 2020-04-03 MED ORDER — ONDANSETRON HCL 4 MG/2ML IJ SOLN: 4.0000 mg | INTRAMUSCULAR | Status: DC | PRN

## 2020-04-03 MED ORDER — SODIUM CHLORIDE 0.9 % IV SOLN
INTRAVENOUS | Status: DC | PRN
  Administered 2020-04-03: 250 mL via INTRAVENOUS

## 2020-04-03 MED ORDER — MISOPROSTOL 200 MCG PO TABS
ORAL_TABLET | ORAL | Status: AC
  Filled 2020-04-03: qty 2

## 2020-04-03 MED ORDER — COCONUT OIL OIL
1.0000 "application " | TOPICAL_OIL | Status: DC | PRN
  Administered 2020-04-04: 1 via TOPICAL

## 2020-04-03 MED ORDER — BENZOCAINE-MENTHOL 20-0.5 % EX AERO
1.0000 "application " | INHALATION_SPRAY | CUTANEOUS | Status: DC | PRN
  Administered 2020-04-03: 1 via TOPICAL
  Filled 2020-04-03: qty 56

## 2020-04-03 MED ORDER — METHYLERGONOVINE MALEATE 0.2 MG/ML IJ SOLN: 0.2000 mg | INTRAMUSCULAR | Status: DC | PRN

## 2020-04-03 MED ORDER — WITCH HAZEL-GLYCERIN EX PADS
1.0000 "application " | MEDICATED_PAD | CUTANEOUS | Status: DC | PRN
  Administered 2020-04-03: 1 via TOPICAL

## 2020-04-03 MED ORDER — TETANUS-DIPHTH-ACELL PERTUSSIS 5-2.5-18.5 LF-MCG/0.5 IM SUSP: 0.5000 mL | Freq: Once | INTRAMUSCULAR | Status: DC

## 2020-04-03 MED ORDER — OXYTOCIN-SODIUM CHLORIDE 30-0.9 UT/500ML-% IV SOLN
INTRAVENOUS | Status: AC
  Administered 2020-04-03: 333 mL
  Filled 2020-04-03: qty 500

## 2020-04-03 MED ORDER — ACETAMINOPHEN 500 MG PO TABS
1000.0000 mg | ORAL_TABLET | Freq: Four times a day (QID) | ORAL | Status: DC
  Administered 2020-04-03: 1000 mg via ORAL
  Filled 2020-04-03 (×4): qty 2

## 2020-04-03 MED ORDER — AMMONIA AROMATIC IN INHA
RESPIRATORY_TRACT | Status: AC
  Filled 2020-04-03: qty 10

## 2020-04-03 MED ORDER — IBUPROFEN 600 MG PO TABS
600.0000 mg | ORAL_TABLET | Freq: Four times a day (QID) | ORAL | Status: DC
  Administered 2020-04-03 (×2): 600 mg via ORAL
  Filled 2020-04-03 (×6): qty 1

## 2020-04-03 MED ORDER — PRENATAL MULTIVITAMIN CH
1.0000 | ORAL_TABLET | Freq: Every day | ORAL | Status: DC
  Administered 2020-04-03: 1 via ORAL
  Filled 2020-04-03 (×2): qty 1

## 2020-04-03 MED ORDER — SODIUM CHLORIDE 0.9 % IV SOLN
500.0000 mg | Freq: Once | INTRAVENOUS | Status: AC
  Administered 2020-04-03: 500 mg via INTRAVENOUS
  Filled 2020-04-03: qty 25

## 2020-04-03 MED ORDER — MISOPROSTOL 200 MCG PO TABS
ORAL_TABLET | ORAL | Status: AC
  Administered 2020-04-03: 400 ug via RECTAL
  Filled 2020-04-03: qty 4

## 2020-04-03 MED ORDER — METHYLERGONOVINE MALEATE 0.2 MG PO TABS: 0.2000 mg | ORAL_TABLET | ORAL | Status: DC | PRN

## 2020-04-03 MED ORDER — SENNOSIDES-DOCUSATE SODIUM 8.6-50 MG PO TABS
2.0000 | ORAL_TABLET | ORAL | Status: DC
  Filled 2020-04-03: qty 2

## 2020-04-03 MED ORDER — ZOLPIDEM TARTRATE 5 MG PO TABS: 5.0000 mg | ORAL_TABLET | Freq: Every evening | ORAL | Status: DC | PRN

## 2020-04-03 MED ORDER — FLEET ENEMA 7-19 GM/118ML RE ENEM: 1.0000 | ENEMA | Freq: Every day | RECTAL | Status: DC | PRN

## 2020-04-03 MED ORDER — DIPHENHYDRAMINE HCL 25 MG PO CAPS: 25.0000 mg | ORAL_CAPSULE | Freq: Four times a day (QID) | ORAL | Status: DC | PRN

## 2020-04-03 MED ORDER — BISACODYL 10 MG RE SUPP
10.0000 mg | Freq: Every day | RECTAL | Status: DC | PRN
  Filled 2020-04-03: qty 1

## 2020-04-03 MED ORDER — OXYTOCIN-SODIUM CHLORIDE 30-0.9 UT/500ML-% IV SOLN: 2.5000 [IU]/h | INTRAVENOUS | Status: DC

## 2020-04-03 MED ORDER — DIBUCAINE (PERIANAL) 1 % EX OINT: 1.0000 "application " | TOPICAL_OINTMENT | CUTANEOUS | Status: DC | PRN

## 2020-04-03 MED ORDER — SIMETHICONE 80 MG PO CHEW: 80.0000 mg | CHEWABLE_TABLET | ORAL | Status: DC | PRN

## 2020-04-03 NOTE — Progress Notes (Signed)
PPD #1, SVD, left labial repair, s/p PPH, baby girl no name yet  S:  Reports feeling okay, tired and sore; reports some buttock/tailbone pain, but relieved with Motrin.              Tolerating po/ No nausea or vomiting / Denies dizziness or SOB, CP             Bleeding is getting lighter             Pain controlled with Motrin             Up ad lib / ambulatory / voiding QS  Newborn breast feeding- going well, but states baby has been sleepy  O:               VS: BP (!) 98/53    Pulse 87    Temp 98.6 F (37 C)    Resp 16    Ht 5\' 6"  (1.676 m)    Wt 82.6 kg    SpO2 99%    Breastfeeding Unknown    BMI 29.41 kg/m  Patient Vitals for the past 24 hrs:  BP Temp Temp src Pulse Resp SpO2 Height Weight  04/03/20 1518 (!) 92/58 98.7 F (37.1 C) Oral 81 18 99 % -- --  04/03/20 1119 (!) 98/53 98.6 F (37 C) Oral 87 16 99 % -- --  04/03/20 0649 (!) 91/51 99.8 F (37.7 C) Oral 94 18 -- -- --  04/03/20 0538 (!) 103/59 100.3 F (37.9 C) Oral (!) 105 18 99 % -- --  04/03/20 0409 (!) 85/55 -- -- -- -- -- -- --  04/03/20 0330 95/61 -- -- (!) 110 18 -- -- --  04/03/20 0300 96/66 -- -- 97 18 -- -- --  04/03/20 0245 111/69 -- -- 94 18 -- -- --  04/03/20 0230 (!) 107/48 -- -- 93 18 -- -- --  04/03/20 0215 106/78 -- -- (!) 150 18 -- -- --  04/02/20 2330 (!) 110/47 -- -- 83 18 -- -- --  04/02/20 2228 109/67 -- -- 72 20 -- -- --  04/02/20 2120 (!) 95/56 98.3 F (36.8 C) Oral 79 18 -- -- --  04/02/20 2010 114/60 -- -- 76 20 -- -- --  04/02/20 1924 (!) 104/56 -- -- 82 18 -- -- --  04/02/20 1822 117/68 -- -- 75 20 -- -- --  04/02/20 1719 103/64 98.6 F (37 C) Oral 84 18 -- -- --  04/02/20 1714 -- -- -- -- -- -- 5\' 6"  (1.676 m) 82.6 kg    LABS:              Results for orders placed or performed during the hospital encounter of 04/02/20 (from the past 24 hour(s))  CBC     Status: Abnormal   Collection Time: 04/02/20  6:11 PM  Result Value Ref Range   WBC 7.6 4.0 - 10.5 K/uL   RBC 4.05 3.87 - 5.11  MIL/uL   Hemoglobin 11.7 (L) 12.0 - 15.0 g/dL   HCT 06/02/20 (L) 36 - 46 %   MCV 88.6 80.0 - 100.0 fL   MCH 28.9 26.0 - 34.0 pg   MCHC 32.6 30.0 - 36.0 g/dL   RDW 06/02/20 (H) 46.2 - 70.3 %   Platelets 192 150 - 400 K/uL   nRBC 0.0 0.0 - 0.2 %  RPR     Status: None   Collection Time: 04/02/20  6:11 PM  Result Value Ref Range  RPR Ser Ql NON REACTIVE NON REACTIVE  Type and screen     Status: None   Collection Time: 04/02/20  6:11 PM  Result Value Ref Range   ABO/RH(D) A POS    Antibody Screen NEG    Sample Expiration      04/05/2020,2359 Performed at [facility], [address], [place], [place] [address]   CBC     Status: Abnormal   Collection Time: 04/03/20  4:59 AM  Result Value Ref Range   WBC 15.3 (H) 4.0 - 10.5 K/uL   RBC 3.19 (L) 3.87 - 5.11 MIL/uL   Hemoglobin 9.4 (L) 12.0 - 15.0 g/dL   HCT 68.3 (L) 36 - 46 %   MCV 90.9 80.0 - 100.0 fL   MCH 29.5 26.0 - 34.0 pg   MCHC 32.4 30.0 - 36.0 g/dL   RDW 41.9 (H) 62.2 - 29.7 %   Platelets 195 150 - 400 K/uL   nRBC 0.0 0.0 - 0.2 %  CBC     Status: Abnormal   Collection Time: 04/03/20  2:11 PM  Result Value Ref Range   WBC 11.5 (H) 4.0 - 10.5 K/uL   RBC 2.69 (L) 3.87 - 5.11 MIL/uL   Hemoglobin 7.9 (L) 12.0 - 15.0 g/dL   HCT 98.9 (L) 36 - 46 %   MCV 88.8 80.0 - 100.0 fL   MCH 29.4 26.0 - 34.0 pg   MCHC 33.1 30.0 - 36.0 g/dL   RDW 21.1 (H) 94.1 - 74.0 %   Platelets 161 150 - 400 K/uL   nRBC 0.0 0.0 - 0.2 %               Blood type: --/--/A POS (08/04 1811)  Rubella: Immune (01/15 0000)                     I&O: Intake/Output      08/04 0701 - 08/05 0700 08/05 0701 - 08/06 0700   Urine (mL/kg/hr) 0    Blood 1030    Total Output 1030    Net -1030         Urine Occurrence 2 x                  Physical Exam:             Alert and oriented X3  Lungs: Clear and unlabored  Heart: regular rate and rhythm / no murmurs  Abdomen: soft, non-tender, non-distended              Fundus: firm, non-tender, U-1  Perineum:  intact; mild edema   Lochia: small lochia, no clots; ice pack in place  Extremities: no edema, no calf pain or tenderness    A: PPD # 1, SVD  S/p PPH  ABL Anemia compounding chronic IDA   - Repeated CBC at 1400 with worsening anemia, fundus is firm and bleeding scant   - s/p Injectafer infusion x 2 during pregnancy on 5/25 and 6/1   - discussed with pharmacy, and okay for patient to receive an additional dose of IV iron, but we do have Injectafer at this hospital.    - Plan for Venofer x 1 dose, then okay to d/c IV   Doing well - stable status  Routine post partum orders  Lactation support PRN  Encouraged to rest when baby rests  Desires early d/c home tomorrow   Carlean Jews, MSN, CNM [facility]

## 2020-04-03 NOTE — Progress Notes (Signed)
Patient reported to RN that she had a few small blood clots when she went to the restroom. Clots were already disposed of when RN entered room. Fundus is firm, midline and 1 below the umbilicus. Lochia is rubra and small. Patient will call RN if she passes more clots or if bleeding increases. Will continue to monitor

## 2020-04-04 MED ORDER — COCONUT OIL OIL: 1.0000 "application " | TOPICAL_OIL | 0 refills | Status: AC | PRN

## 2020-04-04 MED ORDER — IBUPROFEN 600 MG PO TABS: 600.0000 mg | ORAL_TABLET | Freq: Four times a day (QID) | ORAL | 0 refills | Status: AC

## 2020-04-04 MED ORDER — ACETAMINOPHEN 500 MG PO TABS: 1000.0000 mg | ORAL_TABLET | Freq: Four times a day (QID) | ORAL | 0 refills | Status: AC

## 2020-04-04 MED ORDER — BENZOCAINE-MENTHOL 20-0.5 % EX AERO: 1.0000 "application " | INHALATION_SPRAY | CUTANEOUS | 1 refills | Status: AC | PRN

## 2020-04-04 NOTE — Discharge Summary (Signed)
OB Discharge Summary  Patient Name: Andrea Bradford DOB: Mar 12, 1981 MRN: [id number]  Date of admission: 04/02/2020 Delivering provider: Neta Mends   Admitting diagnosis: Encounter for induction of labor [Z34.90] Encounter for planned induction of labor [Z34.90] Intrauterine pregnancy: [redacted]w[redacted]d     Secondary diagnosis: Patient Active Problem List   Diagnosis Date Noted  . Acute blood loss anemia 04/03/2020  . PPH (postpartum hemorrhage) 04/03/2020  . Encounter for planned induction of labor 04/03/2020  . SVD  08/14/2018  . Postpartum care following vaginal delivery 8/5 08/14/2018   Additional problems: Anxiety, stable off meds; AMA, IVF pregnancy   Date of discharge: 04/04/2020   Discharge diagnosis: Principal Problem:   Postpartum care following vaginal delivery 8/5 Active Problems:   SVD    Acute blood loss anemia   PPH (postpartum hemorrhage)   Encounter for planned induction of labor                                                             Post partum procedures: IV iron   Augmentation: AROM, Cytotec and OP Foley Pain control: Local  Laceration:Labial  Episiotomy:None  Complications: Hemorrhage>1026mL  Hospital course:  Induction of Labor With Vaginal Delivery   39 y.o. yo [id number] at [redacted]w[redacted]d was admitted to the hospital 04/02/2020 for induction of labor.  Indication for induction: Favorable cervix at term.  Patient had an uncomplicated labor course as follows: Membrane Rupture Time/Date: 12:58 AM ,04/03/2020   Delivery Method:Vaginal, Spontaneous  Episiotomy: None  Lacerations:  Labial  Details of delivery can be found in separate delivery note.  Patient had a postpartum course complicated by PPH. Estimated blood loss at delivery was 1030cc. Admission hemoglobin was 11.7 and 12 hours post delivery it was 7.9. Pt was given IV iron and deemed stable for discharge. Patient is discharged home 04/04/20.  Newborn Data: Birth date:04/03/2020  Birth time:1:54 AM  Gender:Female   Living status:Living  Apgars:8 ,9  Weight:3966 g   Physical exam  Vitals:   04/03/20 1518 04/03/20 1806 04/03/20 2143 04/04/20 0602  BP: (!) 92/58 97/68 (!) 93/55 (!) 84/53  Pulse: 81 75  65  Resp: 18 18 17 16   Temp: 98.7 F (37.1 C) 98.6 F (37 C) 98.9 F (37.2 C) 98 F (36.7 C)  TempSrc: Oral Oral Oral Oral  SpO2: 99% 100%  96%  Weight:      Height:       General: alert, cooperative and no distress Lochia: appropriate Uterine Fundus: firm Perineum: repair intact, no edema DVT Evaluation: No evidence of DVT seen on physical exam. Labs: Lab Results  Component Value Date   WBC 11.5 (H) 04/03/2020   HGB 7.9 (L) 04/03/2020   HCT 23.9 (L) 04/03/2020   MCV 88.8 04/03/2020   PLT 161 04/03/2020   No flowsheet data found. Edinburgh Postnatal Depression Scale Screening Tool 04/03/2020 08/14/2018  I have been able to laugh and see the funny side of things. 0 (No Data)  I have looked forward with enjoyment to things. 0 -  I have blamed myself unnecessarily when things went wrong. 0 -  I have been anxious or worried for no good reason. 1 -  I have felt scared or panicky for no good reason. 0 -  Things have been getting on top of  me. 0 -  I have been so unhappy that I have had difficulty sleeping. 0 -  I have felt sad or miserable. 0 -  I have been so unhappy that I have been crying. 0 -  The thought of harming myself has occurred to me. 0 -  Edinburgh Postnatal Depression Scale Total 1 -   Vaccines: TDaP Declined         Flu    Declined  Discharge instruction:  per After Visit Summary,  [facility] booklet and  "Understanding Mother & Baby Care" hospital booklet  After Visit Meds:  Allergies as of 04/04/2020   No Known Allergies     Medication List    TAKE these medications   acetaminophen 500 MG tablet Commonly known as: TYLENOL Take 2 tablets (1,000 mg total) by mouth every 6 (six) hours.   benzocaine-Menthol 20-0.5 % Aero Commonly known as: DERMOPLAST Apply  1 application topically as needed for irritation (perineal discomfort).   coconut oil Oil Apply 1 application topically as needed.   ibuprofen 600 MG tablet Commonly known as: ADVIL Take 1 tablet (600 mg total) by mouth every 6 (six) hours.   multivitamin with minerals Tabs tablet Take 1 tablet by mouth daily.            Discharge Care Instructions  (From admission, onward)         Start     Ordered   04/04/20 0000  Discharge wound care:       Comments: Sitz baths 2 times /day with warm water x 1 week. May add herbals: 1 ounce dried comfrey leaf* 1 ounce calendula flowers 1 ounce lavender flowers  Supplies can be found online at [employer] sources at [employer], [employer]  1/2 ounce dried uva ursi leaves 1/2 ounce witch hazel blossoms (if you can find them) 1/2 ounce dried sage leaf 1/2 cup sea salt Directions: Bring 2 quarts of water to a boil. Turn off heat, and place 1 ounce (approximately 1 large handful) of the above mixed herbs (not the salt) into the pot. Steep, covered, for 30 minutes.  Strain the liquid well with a fine mesh strainer, and discard the herb material. Add 2 quarts of liquid to the tub, along with the 1/2 cup of salt. This medicinal liquid can also be made into compresses and peri-rinses.   04/04/20 1115         Diet: routine diet  Activity: Advance as tolerated. Pelvic rest for 6 weeks.   Postpartum contraception: Not Discussed  Newborn Data: Live born female  Birth Weight: 8 lb 11.9 oz (3966 g) APGAR: 8, 9  Newborn Delivery   Birth date/time: 04/03/2020 01:54:00 Delivery type: Vaginal, Spontaneous     Named Mia OR Carmela Baby Feeding: Breast Disposition:home with mother  Delivery Report:   Review the Delivery Report for details.    Follow up:  Follow-up Information    Neta Mends, CNM. Schedule an appointment as soon as possible for a visit in 2 week(s).   Specialty: Obstetrics and Gynecology Why:  Please make an appointment for 2 week postpartum check.  Contact information: [address] [place] [address] [phone]              Signed: Clancy Gourd, MSN 04/04/2020, 11:17 AM

## 2020-04-04 NOTE — Progress Notes (Signed)
CSW received consult for hx of Anxiety.  CSW met with MOB to offer support and complete assessment.    CSW congratulated MOB and FOB on the birth of infant. CSW advised MOB of CSW's role and the reason for CSW coming to speak with her. MOB reports no hx of anxiety but does report that "when I would lay down during my pregnancy, it would feel like my heart was racing, so my OB told me that it was likely anxiety". CSW validated this and asked MOB did she ever have any anxious thoughts, in which MOB denied. MOB reports that she was never prescribed medication for her anxiety and declined therapy resources at this time. MOB expressed that she isn't feeling SI or HI.   MOB reported that she has a great support system which include primarily family as well as her nanny. MOB expressed that they have all needed items to care for infant and reported no other concerns or needs at this time.   CSW provided education regarding the baby blues period vs. perinatal mood disorders, discussed treatment.  CSW recommends self-evaluation during the postpartum time period using the New Mom Checklist from Postpartum Progress and encouraged MOB to contact a medical professional if symptoms are noted at any time.   CSW provided review of Sudden Infant Death Syndrome (SIDS) precautions.   CSW identifies no further need for intervention and no barriers to discharge at this time.   Brigido Mera S. Nyshaun Standage, MSW, LCSW [facility] [phone]
